# Patient Record
Sex: Female | Born: 1975 | Race: White | Hispanic: No | Smoking: Former smoker
Health system: Southern US, Community
[De-identification: ages and names within clinical notes are randomized; demographics above are authoritative.]

## PROBLEM LIST (undated history)

## (undated) DIAGNOSIS — R112 Nausea with vomiting, unspecified: Secondary | ICD-10-CM

## (undated) DIAGNOSIS — N6314 Unspecified lump in the right breast, lower inner quadrant: Secondary | ICD-10-CM

## (undated) DIAGNOSIS — G43709 Chronic migraine without aura, not intractable, without status migrainosus: Secondary | ICD-10-CM

## (undated) DIAGNOSIS — IMO0002 Reserved for concepts with insufficient information to code with codable children: Secondary | ICD-10-CM

## (undated) DIAGNOSIS — G90A Postural orthostatic tachycardia syndrome (POTS): Secondary | ICD-10-CM

## (undated) DIAGNOSIS — Z9889 Other specified postprocedural states: Secondary | ICD-10-CM

## (undated) DIAGNOSIS — K602 Anal fissure, unspecified: Secondary | ICD-10-CM

## (undated) DIAGNOSIS — I10 Essential (primary) hypertension: Secondary | ICD-10-CM

## (undated) DIAGNOSIS — I498 Other specified cardiac arrhythmias: Secondary | ICD-10-CM

## (undated) DIAGNOSIS — F988 Other specified behavioral and emotional disorders with onset usually occurring in childhood and adolescence: Secondary | ICD-10-CM

## (undated) HISTORY — DX: Reserved for concepts with insufficient information to code with codable children: IMO0002

## (undated) HISTORY — PX: WISDOM TOOTH EXTRACTION: SHX21

## (undated) HISTORY — PX: COLONOSCOPY: SHX174

## (undated) HISTORY — DX: Other specified behavioral and emotional disorders with onset usually occurring in childhood and adolescence: F98.8

## (undated) HISTORY — DX: Chronic migraine without aura, not intractable, without status migrainosus: G43.709

## (undated) HISTORY — DX: Essential (primary) hypertension: I10

## (undated) HISTORY — PX: LIPOSUCTION: SHX10

## (undated) HISTORY — DX: Anal fissure, unspecified: K60.2

---
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
## 2017-12-01 ENCOUNTER — Other Ambulatory Visit: Payer: Self-pay | Admitting: Physician Assistant

## 2017-12-01 DIAGNOSIS — Z139 Encounter for screening, unspecified: Secondary | ICD-10-CM

## 2017-12-20 ENCOUNTER — Encounter: Payer: Self-pay | Admitting: Gastroenterology

## 2017-12-29 ENCOUNTER — Ambulatory Visit
Admission: RE | Admit: 2017-12-29 | Discharge: 2017-12-29 | Disposition: A | Discharge status: Home / Self Care | Payer: 59 | Source: Ambulatory Visit | Attending: Physician Assistant | Admitting: Physician Assistant

## 2017-12-29 DIAGNOSIS — Z139 Encounter for screening, unspecified: Secondary | ICD-10-CM

## 2017-12-30 ENCOUNTER — Other Ambulatory Visit: Payer: Self-pay | Admitting: Physician Assistant

## 2017-12-30 DIAGNOSIS — R928 Other abnormal and inconclusive findings on diagnostic imaging of breast: Secondary | ICD-10-CM

## 2018-01-03 ENCOUNTER — Other Ambulatory Visit: Payer: 59

## 2018-01-07 ENCOUNTER — Ambulatory Visit
Admission: RE | Admit: 2018-01-07 | Discharge: 2018-01-07 | Disposition: A | Discharge status: Home / Self Care | Payer: 59 | Source: Ambulatory Visit | Attending: Physician Assistant | Admitting: Physician Assistant

## 2018-01-07 ENCOUNTER — Other Ambulatory Visit: Payer: Self-pay | Admitting: Physician Assistant

## 2018-01-07 DIAGNOSIS — R928 Other abnormal and inconclusive findings on diagnostic imaging of breast: Secondary | ICD-10-CM

## 2018-01-07 DIAGNOSIS — N6320 Unspecified lump in the left breast, unspecified quadrant: Secondary | ICD-10-CM

## 2018-01-11 ENCOUNTER — Other Ambulatory Visit: Payer: Self-pay | Admitting: Physician Assistant

## 2018-01-11 ENCOUNTER — Ambulatory Visit
Admission: RE | Admit: 2018-01-11 | Discharge: 2018-01-11 | Disposition: A | Discharge status: Home / Self Care | Payer: 59 | Source: Ambulatory Visit | Attending: Physician Assistant | Admitting: Physician Assistant

## 2018-01-11 DIAGNOSIS — N6320 Unspecified lump in the left breast, unspecified quadrant: Secondary | ICD-10-CM

## 2018-01-12 ENCOUNTER — Other Ambulatory Visit: Payer: 59

## 2018-02-03 ENCOUNTER — Ambulatory Visit: Payer: 59 | Admitting: Gastroenterology

## 2018-02-03 ENCOUNTER — Encounter: Payer: Self-pay | Admitting: Gastroenterology

## 2018-02-03 VITALS — BP 100/60 | HR 64 | Ht 66.0 in | Wt 150.0 lb

## 2018-02-03 DIAGNOSIS — Z8489 Family history of other specified conditions: Secondary | ICD-10-CM

## 2018-02-03 NOTE — Patient Instructions (Signed)
If you are age 44 or older, your body mass index should be between 23-30. Your Body mass index is 24.21 kg/m. If this is out of the aforementioned range listed, please consider follow up with your Primary Care Provider.  If you are age 71 or younger, your body mass index should be between 19-25. Your Body mass index is 24.21 kg/m. If this is out of the aformentioned range listed, please consider follow up with your Primary Care Provider.   You have been scheduled for a colonoscopy. Please follow written instructions given to you at your visit today.  Please pick up your prep supplies at the pharmacy within the next 1-3 days. If you use inhalers (even only as needed), please bring them with you on the day of your procedure. Your physician has requested that you go to www.startemmi.com and enter the access code given to you at your visit today. This web site gives a general overview about your procedure. However, you should still follow specific instructions given to you by our office regarding your preparation for the procedure.  It was a pleasure to meet you today!  Dr. Loletha Carrow

## 2018-02-03 NOTE — Progress Notes (Signed)
[facility] Consult Note:  History: Linda Whitney   "[name]" 02/03/2018  Referring physician: Bernerd Limbo, MD, Junie Spencer, PA  Reason for consult/chief complaint: Colon Cancer Screening (Fx Hx of colon cancer - Mom- dx at age 43/48)   Subjective  HPI:  Linda Whitney is a very pleasant 42 year old woman referred for family history of colon cancer.  She underwent colonoscopy perhaps 8 years ago while living in [facility].  The patient reports that her mother was diagnosed with colon cancer at age 16.    Her lifelong bowel pattern has been a BM about twice a week.  During pregnancy she had a lot of trouble with hemorrhoids and rectal bleeding, it has not bothered her lately.  She denies frequent heartburn, dysphagia, nausea, vomiting, early satiety or weight loss.  ROS:  Review of Systems She denies chest pain, dyspnea or dysuria  Past Medical History: Past Medical History:  Diagnosis Date  . ADD (attention deficit disorder)   . Anal fissure   . Chronic migraine      Past Surgical History: Past Surgical History:  Procedure Laterality Date  . LIPOSUCTION     lower back area after pregnancy     Family History: Family History  Problem Relation Age of Onset  . Colon cancer Mother 54   She does not know anymore about her family history on the mother's side since her mother was adopted. [name] has an adopted brother and no biological siblings.  Social History: Social History   Socioeconomic History  . Marital status: Divorced    Spouse name: Not on file  . Number of children: 4  . Years of education: Not on file  . Highest education level: Not on file  Occupational History  . Occupation: LPC  Social Needs  . Financial resource strain: Not on file  . Food insecurity:    Worry: Not on file    Inability: Not on file  . Transportation needs:    Medical: Not on file    Non-medical: Not on file  Tobacco Use  . Smoking status: Former  Smoker    Types: Cigarettes  . Smokeless tobacco: Never Used  Substance and Sexual Activity  . Alcohol use: Never    Frequency: Never  . Drug use: Never  . Sexual activity: Not on file  Lifestyle  . Physical activity:    Days per week: Not on file    Minutes per session: Not on file  . Stress: Not on file  Relationships  . Social connections:    Talks on phone: Not on file    Gets together: Not on file    Attends religious service: Not on file    Active member of club or organization: Not on file    Attends meetings of clubs or organizations: Not on file    Relationship status: Not on file  Other Topics Concern  . Not on file  Social History Narrative  . Not on file     Allergies: No Known Allergies  Outpatient Meds: Current Outpatient Medications  Medication Sig Dispense Refill  . amphetamine-dextroamphetamine (ADDERALL) 15 MG tablet Take 15 mg by mouth. Only takes on days she works    . b complex vitamins tablet Take 1 tablet by mouth daily.    Marland Kitchen latanoprost (XALATAN) 0.005 % ophthalmic solution Place 1 drop into both eyes at bedtime.    . MULTIPLE VITAMIN PO Take by mouth. 1 packet of powder daily    .  Omega-3-6-9 CAPS Take 2 capsules by mouth daily.     No current facility-administered medications for this visit.       ___________________________________________________________________ Objective   Exam:  BP 100/60   Pulse 64   Ht 5\' 6"  (1.676 m)   Wt 150 lb (68 kg)   BMI 24.21 kg/m    General: this is a(n) well-appearing woman  Eyes: sclera anicteric, no redness  ENT: oral mucosa moist without lesions, no cervical or supraclavicular lymphadenopathy, good dentition  CV: RRR without murmur, S1/S2, no JVD, no peripheral edema  Resp: clear to auscultation bilaterally, normal RR and effort noted  GI: soft, no tenderness, with active bowel sounds. No guarding or palpable organomegaly noted.  Skin; warm and dry, no rash or jaundice noted  Neuro:  awake, alert and oriented x 3. Normal gross motor function and fluent speech  Labs:  Normal CBC and LFTs at PCP appt Feb 19  Assessment: Encounter Diagnosis  Name Primary?  . Family history of benign neoplasm of colon Yes    Plan:  Colonoscopy.  She is agreeable after a discussion of procedure and risks.  The benefits and risks of the planned procedure were described in detail with the patient or (when appropriate) their health care proxy.  Risks were outlined as including, but not limited to, bleeding, infection, perforation, adverse medication reaction leading to cardiac or pulmonary decompensation, or pancreatitis (if ERCP).  The limitation of incomplete mucosal visualization was also discussed.  No guarantees or warranties were given.   Thank you for the courtesy of this consult.  Please call me with any questions or concerns.  Nelida Meuse III  CC: Mindi Curling, [place]

## 2018-04-22 ENCOUNTER — Encounter: Payer: Self-pay | Admitting: Gastroenterology

## 2018-05-02 ENCOUNTER — Ambulatory Visit (AMBULATORY_SURGERY_CENTER): Payer: 59 | Admitting: Gastroenterology

## 2018-05-02 ENCOUNTER — Encounter: Payer: Self-pay | Admitting: Gastroenterology

## 2018-05-02 VITALS — BP 107/74 | HR 73 | Temp 98.4°F | Resp 14 | Ht 66.0 in | Wt 150.0 lb

## 2018-05-02 DIAGNOSIS — Z800 Family history of malignant neoplasm of digestive organs: Secondary | ICD-10-CM | POA: Diagnosis present

## 2018-05-02 DIAGNOSIS — Z1211 Encounter for screening for malignant neoplasm of colon: Secondary | ICD-10-CM | POA: Diagnosis not present

## 2018-05-02 MED ORDER — SODIUM CHLORIDE 0.9 % IV SOLN: 500.0000 mL | Freq: Once | INTRAVENOUS | Status: DC

## 2018-05-02 NOTE — Op Note (Signed)
[facility] Patient Name: Linda Whitney Procedure Date: 05/02/2018 10:34 AM MRN: [id number] Endoscopist: Mallie Mussel L. Loletha Carrow , MD Age: 42 Referring MD:  Date of Birth: 1976/08/27 Gender: Female Account #: [id number] Procedure:                Colonoscopy Indications:              Screening in patient at increased risk: Colorectal                            cancer in mother before age 13 Medicines:                Monitored Anesthesia Care Procedure:                Pre-Anesthesia Assessment:                           - Prior to the procedure, a History and Physical                            was performed, and patient medications and                            allergies were reviewed. The patient's tolerance of                            previous anesthesia was also reviewed. The risks                            and benefits of the procedure and the sedation                            options and risks were discussed with the patient.                            All questions were answered, and informed consent                            was obtained. Prior Anticoagulants: The patient has                            taken no previous anticoagulant or antiplatelet                            agents. ASA Grade Assessment: II - A patient with                            mild systemic disease. After reviewing the risks                            and benefits, the patient was deemed in                            satisfactory condition to undergo the procedure.  After obtaining informed consent, the colonoscope                            was passed under direct vision. Throughout the                            procedure, the patient's blood pressure, pulse, and                            oxygen saturations were monitored continuously. The                            Colonoscope was introduced through the anus and                            advanced to the the cecum,  identified by                            appendiceal orifice and ileocecal valve. The                            colonoscopy was performed without difficulty. The                            patient tolerated the procedure well. The quality                            of the bowel preparation was good. The ileocecal                            valve, appendiceal orifice, and rectum were                            photographed. The quality of the bowel preparation                            was evaluated using the BBPS [facility] Bowel                            Preparation Scale) with scores of: Right Colon = 2,                            Transverse Colon = 2 and Left Colon = 2. The total                            BBPS score equals 6. The bowel preparation used was                            Plenvu. Scope In: 10:56:24 AM Scope Out: 11:14:28 AM Scope Withdrawal Time: 0 hours 9 minutes 27 seconds  Total Procedure Duration: 0 hours 18 minutes 4 seconds  Findings:                 The perianal and digital rectal  examinations were                            normal.                           The entire examined colon appeared normal on direct                            and retroflexion views. Complications:            No immediate complications. Estimated Blood Loss:     Estimated blood loss: none. Impression:               - The entire examined colon is normal on direct and                            retroflexion views.                           - No specimens collected. Recommendation:           - Patient has a contact number available for                            emergencies. The signs and symptoms of potential                            delayed complications were discussed with the                            patient. Return to normal activities tomorrow.                            Written discharge instructions were provided to the                            patient.                            - Resume previous diet.                           - Continue present medications.                           - Repeat colonoscopy in 5 years for screening                            purposes. Asante Ritacco L. Loletha Carrow, MD 05/02/2018 11:19:26 AM This report has been signed electronically.

## 2018-05-02 NOTE — Patient Instructions (Signed)
YOU HAD AN ENDOSCOPIC PROCEDURE TODAY AT [facility]:   Refer to the procedure report that was given to you for any specific questions about what was found during the examination.  If the procedure report does not answer your questions, please call your gastroenterologist to clarify.  If you requested that your care partner not be given the details of your procedure findings, then the procedure report has been included in a sealed envelope for you to review at your convenience later.  YOU SHOULD EXPECT: Some feelings of bloating in the abdomen. Passage of more gas than usual.  Walking can help get rid of the air that was put into your GI tract during the procedure and reduce the bloating. If you had a lower endoscopy (such as a colonoscopy or flexible sigmoidoscopy) you may notice spotting of blood in your stool or on the toilet paper. If you underwent a bowel prep for your procedure, you may not have a normal bowel movement for a few days.  Please Note:  You might notice some irritation and congestion in your nose or some drainage.  This is from the oxygen used during your procedure.  There is no need for concern and it should clear up in a day or so.  SYMPTOMS TO REPORT IMMEDIATELY:   Following lower endoscopy (colonoscopy or flexible sigmoidoscopy):  Excessive amounts of blood in the stool  Significant tenderness or worsening of abdominal pains  Swelling of the abdomen that is new, acute  Fever of 100F or higher   Following upper endoscopy (EGD)  Vomiting of blood or coffee ground material  New chest pain or pain under the shoulder blades  Painful or persistently difficult swallowing  New shortness of breath  Fever of 100F or higher  Black, tarry-looking stools  For urgent or emergent issues, a gastroenterologist can be reached at any hour by calling [phone].   DIET:  We do recommend a small meal at first, but then you may proceed to your regular diet.  Drink  plenty of fluids but you should avoid alcoholic beverages for 24 hours.  ACTIVITY:  You should plan to take it easy for the rest of today and you should NOT DRIVE or use heavy machinery until tomorrow (because of the sedation medicines used during the test).    FOLLOW UP: Our staff will call the number listed on your records the next business day following your procedure to check on you and address any questions or concerns that you may have regarding the information given to you following your procedure. If we do not reach you, we will leave a message.  However, if you are feeling well and you are not experiencing any problems, there is no need to return our call.  We will assume that you have returned to your regular daily activities without incident.  If any biopsies were taken you will be contacted by phone or by letter within the next 1-3 weeks.  Please call us at [phone] if you have not heard about the biopsies in 3 weeks.    SIGNATURES/CONFIDENTIALITY: You and/or your care partner have signed paperwork which will be entered into your electronic medical record.  These signatures attest to the fact that that the information above on your After Visit Summary has been reviewed and is understood.  Full responsibility of the confidentiality of this discharge information lies with you and/or your care-partner.YOU HAD AN ENDOSCOPIC PROCEDURE TODAY AT [facility]:  Refer to the procedure report that was given to you for any specific questions about what was found during the examination.  If the procedure report does not answer your questions, please call your gastroenterologist to clarify.  If you requested that your care partner not be given the details of your procedure findings, then the procedure report has been included in a sealed envelope for you to review at your convenience later.  YOU SHOULD EXPECT: Some feelings of bloating in the abdomen. Passage of more gas than  usual.  Walking can help get rid of the air that was put into your GI tract during the procedure and reduce the bloating. If you had a lower endoscopy (such as a colonoscopy or flexible sigmoidoscopy) you may notice spotting of blood in your stool or on the toilet paper. If you underwent a bowel prep for your procedure, you may not have a normal bowel movement for a few days.  Please Note:  You might notice some irritation and congestion in your nose or some drainage.  This is from the oxygen used during your procedure.  There is no need for concern and it should clear up in a day or so.  SYMPTOMS TO REPORT IMMEDIATELY:   Following lower endoscopy (colonoscopy or flexible sigmoidoscopy):  Excessive amounts of blood in the stool  Significant tenderness or worsening of abdominal pains  Swelling of the abdomen that is new, acute  Fever of 100F or higher  For urgent or emergent issues, a gastroenterologist can be reached at any hour by calling [phone].   DIET:  We do recommend a small meal at first, but then you may proceed to your regular diet.  Drink plenty of fluids but you should avoid alcoholic beverages for 24 hours.  ACTIVITY:  You should plan to take it easy for the rest of today and you should NOT DRIVE or use heavy machinery until tomorrow (because of the sedation medicines used during the test).    FOLLOW UP: Our staff will call the number listed on your records the next business day following your procedure to check on you and address any questions or concerns that you may have regarding the information given to you following your procedure. If we do not reach you, we will leave a message.  However, if you are feeling well and you are not experiencing any problems, there is no need to return our call.  We will assume that you have returned to your regular daily activities without incident.  If any biopsies were taken you will be contacted by phone or by letter within the next  1-3 weeks.  Please call us at [phone] if you have not heard about the biopsies in 3 weeks.   Repeat next Colonoscopy screening in 5 years  SIGNATURES/CONFIDENTIALITY: You and/or your care partner have signed paperwork which will be entered into your electronic medical record.  These signatures attest to the fact that that the information above on your After Visit Summary has been reviewed and is understood.  Full responsibility of the confidentiality of this discharge information lies with you and/or your care-partner.

## 2018-05-02 NOTE — Progress Notes (Signed)
A and O x3. Report to RN. Tolerated MAC anesthesia well.

## 2018-05-03 ENCOUNTER — Telehealth: Payer: Self-pay | Admitting: *Deleted

## 2018-05-03 NOTE — Telephone Encounter (Signed)
  Follow up Call-  Call back number 05/02/2018  Post procedure Call Back phone  # [phone] cell  Permission to leave phone message Yes     Patient questions:  Do you have a fever, pain , or abdominal swelling? No. Pain Score  0 *  Have you tolerated food without any problems? Yes.    Have you been able to return to your normal activities? Yes.    Do you have any questions about your discharge instructions: Diet   No. Medications  No. Follow up visit  No.  Do you have questions or concerns about your Care? No.  Actions: * If pain score is 4 or above: No action needed, pain <4.  Pt did say she ran a fever last night 100.4 after she had taken Tylenol. No fever since and has not taken any more Tylenol.Encouraged pt to drink plenty of fluids today and to call if this does not improve . Pt denied any other symptoms such as bleeding or pain .

## 2019-02-15 ENCOUNTER — Other Ambulatory Visit: Payer: Self-pay | Admitting: Physician Assistant

## 2019-02-15 DIAGNOSIS — N6320 Unspecified lump in the left breast, unspecified quadrant: Secondary | ICD-10-CM

## 2019-02-27 ENCOUNTER — Other Ambulatory Visit: Payer: Self-pay | Admitting: Physician Assistant

## 2019-02-27 DIAGNOSIS — N63 Unspecified lump in unspecified breast: Secondary | ICD-10-CM

## 2019-03-01 ENCOUNTER — Other Ambulatory Visit: Payer: Self-pay

## 2019-03-01 ENCOUNTER — Ambulatory Visit
Admission: RE | Admit: 2019-03-01 | Discharge: 2019-03-01 | Disposition: A | Discharge status: Home / Self Care | Payer: 59 | Source: Ambulatory Visit | Attending: Physician Assistant | Admitting: Physician Assistant

## 2019-03-01 ENCOUNTER — Other Ambulatory Visit: Payer: Self-pay | Admitting: Physician Assistant

## 2019-03-01 ENCOUNTER — Ambulatory Visit: Payer: 59

## 2019-03-01 DIAGNOSIS — N6320 Unspecified lump in the left breast, unspecified quadrant: Secondary | ICD-10-CM

## 2019-03-01 DIAGNOSIS — N63 Unspecified lump in unspecified breast: Secondary | ICD-10-CM

## 2019-03-06 ENCOUNTER — Ambulatory Visit
Admission: RE | Admit: 2019-03-06 | Discharge: 2019-03-06 | Disposition: A | Discharge status: Home / Self Care | Payer: 59 | Source: Ambulatory Visit | Attending: Physician Assistant | Admitting: Physician Assistant

## 2019-03-06 ENCOUNTER — Other Ambulatory Visit: Payer: Self-pay

## 2019-03-06 DIAGNOSIS — N63 Unspecified lump in unspecified breast: Secondary | ICD-10-CM

## 2019-03-16 ENCOUNTER — Other Ambulatory Visit: Payer: Self-pay | Admitting: General Surgery

## 2019-03-16 DIAGNOSIS — N6320 Unspecified lump in the left breast, unspecified quadrant: Secondary | ICD-10-CM

## 2019-03-23 ENCOUNTER — Other Ambulatory Visit: Payer: Self-pay | Admitting: General Surgery

## 2019-03-23 DIAGNOSIS — N6310 Unspecified lump in the right breast, unspecified quadrant: Secondary | ICD-10-CM

## 2019-04-07 NOTE — H&P (Signed)
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 Linda Spencer, PA provides primary care. She goes by"Linda Whitney". Linda Whitney was my chaperone throughout the encounter      She's been followed for some time because of stable bilateral benign masses. She did have a core needle biopsy of her left breast a year ago which was benign There is a benign-appearing mass in the right breast at the 4 o'clock position 2 cm from the nipple. This has enlarged in size from 1.8-2.1 cm. Axillary ultrasound negative. Because of increasing size she underwent core biopsy and that she has fibroepithelial lesion. Differential includes fibroadenoma versus phyllodes tumor. She was referred for consideration of excision which is appropriate.      Family history is negative for breast or ovarian cancer. Linda Whitney died of recurrent colorectal cancer. The Linda Whitney was adopted. Father is living and well. Patient's medical history reveals colonoscopy 2019. Anal fissure. ADD. Migraine headaches. Left breast biopsy 2019. Liposuction Social history she is divorced. Lives in [place]. She has 4 children. 63 year old son lives with her. 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 6 alcohol occasionally. Denies tobacco.      I discussed the physical findings, imaging findings, and pathology report. She understands the differential diagnosis. She wants the lesion removed. I think we can do this through a conservative incision, hopefully circumareolar. To guide this will go to use a radioactive seed She'll be scheduled for right breast lumpectomy with radioactive seed  localization. I discussed the indications, details, techniques with her. She is aware of the risk of bleeding, infection, chronic pain, cosmetic deformity, reoperation if cancer. She knows that I will be fairly conservative and not try to get wide margins. She understands all these issues. All her questions were answered. She agrees with this plan.     Past Surgical History  Breast Biopsy  Bilateral. Oral Surgery   Diagnostic Studies History Colonoscopy  within last year Mammogram  within last year Pap Smear  1-5 years ago  Allergies  Nickel  Dermatitis. Adhesive Tape  Dermatitis. HYDROcodone-Acetaminophen *ANALGESICS - OPIOID*  Nausea, Vomiting. Allergies Reconciled   Medication History  Botox (Injection) Specific strength unknown - Active. Collagen (Oral) Specific strength unknown - Active. Xalatan (0.005% Solution, Ophthalmic) Active. Multi Vitamin (Oral) Active. Omega 3-6-9 (Oral) Active. Maxalt (10MG  Tablet, Oral) Active. Adderall (Oral) Specific strength unknown - Active. B12 Folate (800-800MCG Capsule, Oral) Active. Probiotic (Oral) Specific strength unknown - Active. Medications Reconciled  Social History  Alcohol use  Occasional alcohol use. Caffeine use  Coffee. No drug use  Tobacco use  Former smoker.  Family History Alcohol Abuse  Father. Colon Cancer  Linda Whitney. Migraine Headache  Linda Whitney. Respiratory Condition  Linda Whitney.  Pregnancy / Birth History  Gravida  3 Length (months) of breastfeeding  7-12 Maternal age  5-20 Para  2 Regular periods   Other Problem Gastroesophageal Reflux Disease  Lump In Breast  Migraine Headache     Review of Systems General Present- Weight Gain. Not Present- Appetite Loss, Chills, Fatigue, Fever, Night Sweats and Weight Loss. Skin Not Present- Change in Wart/Mole, Dryness, Hives, Jaundice, New Lesions, Non-Healing  Wounds, Rash and Ulcer. HEENT Not Present- Earache, Hearing Loss,  Hoarseness, Nose Bleed, Oral Ulcers, Ringing in the Ears, Seasonal Allergies, Sinus Pain, Sore Throat, Visual Disturbances, Wears glasses/contact lenses and Yellow Eyes. Respiratory Not Present- Bloody sputum, Chronic Cough, Difficulty Breathing, Snoring and Wheezing. Breast Present- Breast Mass. Not Present- Breast Pain, Nipple Discharge and Skin Changes. Cardiovascular Not Present- Chest Pain, Difficulty Breathing Lying Down, Leg Cramps, Palpitations, Rapid Heart Rate, Shortness of Breath and Swelling of Extremities. Gastrointestinal Not Present- Abdominal Pain, Bloating, Bloody Stool, Change in Bowel Habits, Chronic diarrhea, Constipation, Difficulty Swallowing, Excessive gas, Gets full quickly at meals, Hemorrhoids, Indigestion, Nausea, Rectal Pain and Vomiting. Female Genitourinary Not Present- Frequency, Nocturia, Painful Urination, Pelvic Pain and Urgency. Musculoskeletal Not Present- Back Pain, Joint Pain, Joint Stiffness, Muscle Pain, Muscle Weakness and Swelling of Extremities. Neurological Not Present- Decreased Memory, Fainting, Headaches, Numbness, Seizures, Tingling, Tremor, Trouble walking and Weakness. Psychiatric Not Present- Anxiety, Bipolar, Change in Sleep Pattern, Depression, Fearful and Frequent crying. Endocrine Not Present- Cold Intolerance, Excessive Hunger, Hair Changes, Heat Intolerance, Hot flashes and New Diabetes. Hematology Not Present- Blood Thinners, Easy Bruising, Excessive bleeding, Gland problems, HIV and Persistent Infections.  Vitals Weight: 166 lb Height: 65in Body Surface Area: 1.83 m Body Mass Index: 27.62 kg/m  Temp.: 98.53F (Oral)  Pulse: 87 (Regular)  P.OX: 98% (Room air) BP: 102/78(Sitting, Left Arm, Standard) Pt stated that her BP is normally on the lower side.      Physical Exam  General Mental Status-Alert. General Appearance-Consistent with stated age. Hydration-Well hydrated. Voice-Normal.  Integumentary Note:  Tattoos left infraclavicular area, left upper extremity, left lower extremity, anterior abdominal wall   Head and Neck Head-normocephalic, atraumatic with no lesions or palpable masses. Trachea-midline. Thyroid Gland Characteristics - normal size and consistency.  Eye Eyeball - Bilateral-Extraocular movements intact. Sclera/Conjunctiva - Bilateral-No scleral icterus.  Chest and Lung Exam Chest and lung exam reveals -quiet, even and easy respiratory effort with no use of accessory muscles and on auscultation, normal breath sounds, no adventitious sounds and normal vocal resonance. Inspection Chest Wall - Normal. Back - normal.  Breast Note: Breasts are large. Symmetrical. Mild diffuse lumpiness. She has nipple jewelry bilaterally. There is a 2 cm firm rubbery mobile mass at the 4 o'clock position of the right breast, just outside the areolar margin. There are no other dominant masses on either side. There is no axillary adenopathy.   Cardiovascular Cardiovascular examination reveals -normal heart sounds, regular rate and rhythm with no murmurs and normal pedal pulses bilaterally.  Abdomen Inspection Inspection of the abdomen reveals - No Hernias. Skin - Scar - no surgical scars. Palpation/Percussion Palpation and Percussion of the abdomen reveal - Soft, Non Tender, No Rebound tenderness, No Rigidity (guarding) and No hepatosplenomegaly. Auscultation Auscultation of the abdomen reveals - Bowel sounds normal.  Neurologic Neurologic evaluation reveals -alert and oriented x 3 with no impairment of recent or remote memory. Mental Status-Normal.  Musculoskeletal Normal Exam - Left-Upper Extremity Strength Normal and Lower Extremity Strength Normal. Normal Exam - Right-Upper Extremity Strength Normal and Lower Extremity Strength Normal.  Lymphatic Head & Neck  General Head & Neck Lymphatics: Bilateral - Description - Normal. Axillary  General Axillary  Region: Bilateral - Description - Normal. Tenderness - Non Tender. Femoral & Inguinal  Generalized Femoral & Inguinal Lymphatics: Bilateral - Description - Normal. Tenderness - Non Tender.    Assessment & PLan  BREAST MASS, RIGHT (N63.10)  You have a 2 cm mass in your right breast, lower  outer quadrant. This is fairly close to the nipple and areola I can feel this on physical exam It has enlarged somewhat over the past year Image guided core needle biopsy shows a fibroepithelial lesion I gave you a copy of the pathology report. Most likely this is a benign fibroadenoma but there is a small chance you could have a tumor called phyllodes which can grow much larger  I have advised right breast lumpectomy with radioactive seed localization and you agree I have discussed the indications, techniques, and risk of the surgery with him in detail   HISTORY OF MIGRAINE HEADACHES (Z86.69)  FAMILY HISTORY OF COLON CANCER IN Linda Whitney (Z80.0)  HISTORY OF BREAST BIOPSY (Z98.890)    Edsel Petrin. Dalbert Batman, M.D., [facility] General and Minimally invasive Surgery Breast and Colorectal Surgery Office:   [phone] Pager:   [phone]

## 2019-04-07 NOTE — Pre-Procedure Instructions (Signed)
[id number] - [place], [place] [address]. AT [address] [place] [address] Phone: [phone] Fax: [phone]      Your procedure is scheduled on  04-14-19  Report to [facility] Main Entrance "A" at 10A.M., and check in at the Admitting office.  Call this number if you have problems the morning of surgery:  [phone]  Call [phone] if you have any questions prior to your surgery date Monday-Friday 8am-4pm    Remember:  Do not eat drink after midnight the night before your surgery  You may drink clear liquids until 9AM the morning of your surgery.   Clear liquids allowed are: Water, Non-Citrus Juices (without pulp), Carbonated Beverages, Clear Tea, Black Coffee Only, and Gatorade   Take these medicines the morning of surgery with A SIP OF WATER :none   7 days prior to surgery STOP taking any Aspirin (unless otherwise instructed by your surgeon), Aleve, Naproxen, Ibuprofen, Motrin, Advil, Goody's, BC's, all herbal medications, fish oil, and all vitamins.    The Morning of Surgery  Do not wear jewelry, make-up or nail polish.  Do not wear lotions, powders, or perfumes/colognes, or deodorant  Do not shave 48 hours prior to surgery.  Men may shave face and neck.  Do not bring valuables to the hospital.  [facility] is not responsible for any belongings or valuables.  If you are a smoker, DO NOT Smoke 24 hours prior to surgery IF you wear a CPAP at night please bring your mask, tubing, and machine the morning of surgery   Remember that you must have someone to transport you home after your surgery, and remain with you for 24 hours if you are discharged the same day.   Contacts, glasses, hearing aids, dentures or bridgework may not be worn into surgery.    Leave your suitcase in the car.  After surgery it may be brought to your room.  For patients admitted to the hospital, discharge time will be  determined by your treatment team.  Patients discharged the day of surgery will not be allowed to drive home.    Special instructions:   [facility]- Preparing For Surgery  Before surgery, you can play an important role. Because skin is not sterile, your skin needs to be as free of germs as possible. You can reduce the number of germs on your skin by washing with CHG (chlorahexidine gluconate) Soap before surgery.  CHG is an antiseptic cleaner which kills germs and bonds with the skin to continue killing germs even after washing.    Oral Hygiene is also important to reduce your risk of infection.  Remember - BRUSH YOUR TEETH THE MORNING OF SURGERY WITH YOUR REGULAR TOOTHPASTE  Please do not use if you have an allergy to CHG or antibacterial soaps. If your skin becomes reddened/irritated stop using the CHG.  Do not shave (including legs and underarms) for at least 48 hours prior to first CHG shower. It is OK to shave your face.  Please follow these instructions carefully.   1. Shower the NIGHT BEFORE SURGERY and the MORNING OF SURGERY with CHG Soap.   2. If you chose to wash your hair, wash your hair first as usual with your normal shampoo.  3. After you shampoo, rinse your hair and body thoroughly to remove the shampoo.  4. Use CHG as you would any other liquid soap. You can apply CHG directly to the skin and wash gently  with a scrungie or a clean washcloth.   5. Apply the CHG Soap to your body ONLY FROM THE NECK DOWN.  Do not use on open wounds or open sores. Avoid contact with your eyes, ears, mouth and genitals (private parts). Wash Face and genitals (private parts)  with your normal soap.   6. Wash thoroughly, paying special attention to the area where your surgery will be performed.  7. Thoroughly rinse your body with warm water from the neck down.  8. DO NOT shower/wash with your normal soap after using and rinsing off the CHG Soap.  9. Pat yourself dry with a CLEAN  TOWEL.  10. Wear CLEAN PAJAMAS to bed the night before surgery, wear comfortable clothes the morning of surgery  11. Place CLEAN SHEETS on your bed the night of your first shower and DO NOT SLEEP WITH PETS.  Day of Surgery:  Do not apply any deodorants/lotions. Please shower the morning of surgery with the CHG soap  Please wear clean clothes to the hospital/surgery center.   Remember to brush your teeth WITH YOUR REGULAR TOOTHPASTE.  Please read over the fact sheets that you were given.

## 2019-04-10 ENCOUNTER — Other Ambulatory Visit: Payer: Self-pay

## 2019-04-10 ENCOUNTER — Encounter (HOSPITAL_COMMUNITY): Payer: Self-pay

## 2019-04-10 ENCOUNTER — Encounter (HOSPITAL_COMMUNITY)
Admission: RE | Admit: 2019-04-10 | Discharge: 2019-04-10 | Disposition: A | Discharge status: Home / Self Care | Payer: 59 | Source: Ambulatory Visit | Attending: General Surgery | Admitting: General Surgery

## 2019-04-10 DIAGNOSIS — Z20828 Contact with and (suspected) exposure to other viral communicable diseases: Secondary | ICD-10-CM | POA: Insufficient documentation

## 2019-04-10 DIAGNOSIS — I10 Essential (primary) hypertension: Secondary | ICD-10-CM | POA: Insufficient documentation

## 2019-04-10 DIAGNOSIS — Z01818 Encounter for other preprocedural examination: Secondary | ICD-10-CM | POA: Diagnosis present

## 2019-04-10 HISTORY — DX: Postural orthostatic tachycardia syndrome (POTS): G90.A

## 2019-04-10 HISTORY — DX: Other specified postprocedural states: R11.2

## 2019-04-10 HISTORY — DX: Other specified postprocedural states: Z98.890

## 2019-04-10 HISTORY — DX: Other specified cardiac arrhythmias: I49.8

## 2019-04-10 LAB — BASIC METABOLIC PANEL
Anion gap: 8 (ref 5–15)
BUN: 14 mg/dL (ref 6–20)
CO2: 24 mmol/L (ref 22–32)
Calcium: 8.5 mg/dL — ABNORMAL LOW (ref 8.9–10.3)
Chloride: 105 mmol/L (ref 98–111)
Creatinine, Ser: 0.79 mg/dL (ref 0.44–1.00)
GFR calc Af Amer: >60 mL/min (ref >60)
GFR calc non Af Amer: >60 mL/min (ref >60)
Glucose, Bld: 106 mg/dL — ABNORMAL HIGH (ref 70–99)
Potassium: 4.3 mmol/L (ref 3.5–5.1)
Sodium: 137 mmol/L (ref 135–145)

## 2019-04-10 LAB — POCT PREGNANCY, URINE: Preg Test, Ur: NEGATIVE

## 2019-04-10 LAB — CBC
HCT: 39.6 % (ref 36.0–46.0)
Hemoglobin: 12.6 g/dL (ref 12.0–15.0)
MCH: 30.9 pg (ref 26.0–34.0)
MCHC: 31.8 g/dL (ref 30.0–36.0)
MCV: 97.1 fL (ref 80.0–100.0)
Platelets: 225 10*3/uL (ref 150–400)
RBC: 4.08 MIL/uL (ref 3.87–5.11)
RDW: 12.1 % (ref 11.5–15.5)
WBC: 5.5 10*3/uL (ref 4.0–10.5)
nRBC: 0 % (ref 0.0–0.2)

## 2019-04-10 NOTE — Progress Notes (Signed)
PCP - Junie Spencer PA-C Cardiologist - denies  Chest x-ray - denies EKG - denies Stress Test - denies  ECHO - denies Cardiac Cath - denies  Sleep Study - denies CPAP - denies  Blood Thinner Instructions: N/A Aspirin Instructions: N/A  Anesthesia review: No  Coronavirus Screening  Have you experienced the following symptoms:  Cough yes/no: No Fever (>100.74F)  yes/no: No Runny nose yes/no: No Sore throat yes/no: No Difficulty breathing/shortness of breath  yes/no: No  Have you or a family member traveled in the last 14 days and where? yes/no: No  If the patient indicates "YES" to the above questions, their PAT will be rescheduled to limit the exposure to others and, the surgeon will be notified. THE PATIENT WILL NEED TO BE ASYMPTOMATIC FOR 14 DAYS.   If the patient is not experiencing any of these symptoms, the PAT nurse will instruct them to NOT bring anyone with them to their appointment since they may have these symptoms or traveled as well.   Please remind your patients and families that hospital visitation restrictions are in effect and the importance of the restrictions.   Patient denies shortness of breath, fever, cough and chest pain at PAT appointment  Patient verbalized understanding of instructions that were given to them at the PAT appointment. Patient was also instructed that they will need to review over the PAT instructions again at home before surgery.

## 2019-04-11 ENCOUNTER — Other Ambulatory Visit (HOSPITAL_COMMUNITY)
Admission: RE | Admit: 2019-04-11 | Discharge: 2019-04-11 | Disposition: A | Discharge status: Home / Self Care | Payer: 59 | Source: Ambulatory Visit | Attending: General Surgery | Admitting: General Surgery

## 2019-04-11 DIAGNOSIS — Z01818 Encounter for other preprocedural examination: Secondary | ICD-10-CM | POA: Diagnosis not present

## 2019-04-11 LAB — SARS CORONAVIRUS 2 (TAT 6-24 HRS): SARS Coronavirus 2: NEGATIVE

## 2019-04-13 ENCOUNTER — Ambulatory Visit
Admission: RE | Admit: 2019-04-13 | Discharge: 2019-04-13 | Disposition: A | Discharge status: Home / Self Care | Payer: 59 | Source: Ambulatory Visit | Attending: General Surgery | Admitting: General Surgery

## 2019-04-13 ENCOUNTER — Other Ambulatory Visit: Payer: Self-pay

## 2019-04-13 DIAGNOSIS — N6310 Unspecified lump in the right breast, unspecified quadrant: Secondary | ICD-10-CM

## 2019-04-14 ENCOUNTER — Ambulatory Visit
Admission: RE | Admit: 2019-04-14 | Discharge: 2019-04-14 | Disposition: A | Discharge status: Home / Self Care | Payer: 59 | Source: Ambulatory Visit | Attending: General Surgery | Admitting: General Surgery

## 2019-04-14 ENCOUNTER — Encounter (HOSPITAL_COMMUNITY)
Admission: RE | Disposition: A | Discharge status: Home / Self Care | Payer: Self-pay | Source: Home / Self Care | Attending: General Surgery

## 2019-04-14 ENCOUNTER — Ambulatory Visit (HOSPITAL_COMMUNITY): Payer: 59 | Admitting: Anesthesiology

## 2019-04-14 ENCOUNTER — Encounter (HOSPITAL_COMMUNITY): Payer: Self-pay | Admitting: Anesthesiology

## 2019-04-14 ENCOUNTER — Ambulatory Visit (HOSPITAL_COMMUNITY)
Admission: RE | Admit: 2019-04-14 | Discharge: 2019-04-14 | Disposition: A | Discharge status: Home / Self Care | Payer: 59 | Attending: General Surgery | Admitting: General Surgery

## 2019-04-14 DIAGNOSIS — Z79899 Other long term (current) drug therapy: Secondary | ICD-10-CM | POA: Insufficient documentation

## 2019-04-14 DIAGNOSIS — Z87891 Personal history of nicotine dependence: Secondary | ICD-10-CM | POA: Diagnosis not present

## 2019-04-14 DIAGNOSIS — D241 Benign neoplasm of right breast: Secondary | ICD-10-CM | POA: Insufficient documentation

## 2019-04-14 DIAGNOSIS — I10 Essential (primary) hypertension: Secondary | ICD-10-CM | POA: Insufficient documentation

## 2019-04-14 DIAGNOSIS — N6314 Unspecified lump in the right breast, lower inner quadrant: Secondary | ICD-10-CM | POA: Diagnosis present

## 2019-04-14 DIAGNOSIS — F988 Other specified behavioral and emotional disorders with onset usually occurring in childhood and adolescence: Secondary | ICD-10-CM | POA: Insufficient documentation

## 2019-04-14 DIAGNOSIS — G43909 Migraine, unspecified, not intractable, without status migrainosus: Secondary | ICD-10-CM | POA: Diagnosis not present

## 2019-04-14 DIAGNOSIS — N6310 Unspecified lump in the right breast, unspecified quadrant: Secondary | ICD-10-CM

## 2019-04-14 DIAGNOSIS — Z800 Family history of malignant neoplasm of digestive organs: Secondary | ICD-10-CM | POA: Diagnosis not present

## 2019-04-14 HISTORY — PX: BREAST LUMPECTOMY WITH RADIOACTIVE SEED LOCALIZATION: SHX6424

## 2019-04-14 HISTORY — DX: Unspecified lump in the right breast, lower inner quadrant: N63.14

## 2019-04-14 HISTORY — PX: BREAST EXCISIONAL BIOPSY: SUR124

## 2019-04-14 SURGERY — BREAST LUMPECTOMY WITH RADIOACTIVE SEED LOCALIZATION
Anesthesia: General | Site: Breast | Laterality: Right

## 2019-04-14 MED ORDER — ONDANSETRON HCL 4 MG/2ML IJ SOLN
INTRAMUSCULAR | Status: DC | PRN
  Administered 2019-04-14: 4 mg via INTRAVENOUS

## 2019-04-14 MED ORDER — GABAPENTIN 300 MG PO CAPS
300.0000 mg | ORAL_CAPSULE | ORAL | Status: AC
  Administered 2019-04-14: 300 mg via ORAL
  Filled 2019-04-14: qty 1

## 2019-04-14 MED ORDER — KETAMINE HCL 50 MG/5ML IJ SOSY
PREFILLED_SYRINGE | INTRAMUSCULAR | Status: AC
  Filled 2019-04-14: qty 5

## 2019-04-14 MED ORDER — BUPIVACAINE-EPINEPHRINE 0.25% -1:200000 IJ SOLN
INTRAMUSCULAR | Status: DC | PRN
  Administered 2019-04-14: 10 mL

## 2019-04-14 MED ORDER — TRAMADOL HCL 50 MG PO TABS
ORAL_TABLET | ORAL | Status: AC
  Filled 2019-04-14: qty 1

## 2019-04-14 MED ORDER — ACETAMINOPHEN 500 MG PO TABS
1000.0000 mg | ORAL_TABLET | ORAL | Status: AC
  Administered 2019-04-14: 1000 mg via ORAL
  Filled 2019-04-14: qty 2

## 2019-04-14 MED ORDER — MIDAZOLAM HCL 5 MG/5ML IJ SOLN
INTRAMUSCULAR | Status: DC | PRN
  Administered 2019-04-14: 2 mg via INTRAVENOUS

## 2019-04-14 MED ORDER — CHLORHEXIDINE GLUCONATE CLOTH 2 % EX PADS: 6.0000 | MEDICATED_PAD | Freq: Once | CUTANEOUS | Status: DC

## 2019-04-14 MED ORDER — FENTANYL CITRATE (PF) 100 MCG/2ML IJ SOLN
25.0000 ug | INTRAMUSCULAR | Status: DC | PRN
  Administered 2019-04-14: 50 ug via INTRAVENOUS

## 2019-04-14 MED ORDER — CEFAZOLIN SODIUM-DEXTROSE 2-4 GM/100ML-% IV SOLN
2.0000 g | INTRAVENOUS | Status: AC
  Administered 2019-04-14: 2 g via INTRAVENOUS
  Filled 2019-04-14: qty 100

## 2019-04-14 MED ORDER — ACETAMINOPHEN 650 MG RE SUPP
650.0000 mg | RECTAL | Status: DC | PRN
  Filled 2019-04-14: qty 1

## 2019-04-14 MED ORDER — SODIUM CHLORIDE 0.9% FLUSH: 3.0000 mL | INTRAVENOUS | Status: DC | PRN

## 2019-04-14 MED ORDER — PROPOFOL 10 MG/ML IV BOLUS
INTRAVENOUS | Status: DC | PRN
  Administered 2019-04-14: 200 mg via INTRAVENOUS

## 2019-04-14 MED ORDER — LIDOCAINE 2% (20 MG/ML) 5 ML SYRINGE
INTRAMUSCULAR | Status: DC | PRN
  Administered 2019-04-14: 100 mg via INTRAVENOUS

## 2019-04-14 MED ORDER — FENTANYL CITRATE (PF) 100 MCG/2ML IJ SOLN
INTRAMUSCULAR | Status: AC
  Filled 2019-04-14: qty 2

## 2019-04-14 MED ORDER — METOCLOPRAMIDE HCL 5 MG/ML IJ SOLN: 10.0000 mg | Freq: Once | INTRAMUSCULAR | Status: DC | PRN

## 2019-04-14 MED ORDER — KETAMINE HCL 10 MG/ML IJ SOLN
INTRAMUSCULAR | Status: DC | PRN
  Administered 2019-04-14: 20 mg via INTRAVENOUS
  Administered 2019-04-14: 5 mg via INTRAVENOUS

## 2019-04-14 MED ORDER — MIDAZOLAM HCL 2 MG/2ML IJ SOLN
INTRAMUSCULAR | Status: AC
  Filled 2019-04-14: qty 2

## 2019-04-14 MED ORDER — LACTATED RINGERS IV SOLN
INTRAVENOUS | Status: DC | PRN
  Administered 2019-04-14: 11:00:00 via INTRAVENOUS

## 2019-04-14 MED ORDER — PROPOFOL 10 MG/ML IV BOLUS
INTRAVENOUS | Status: AC
  Filled 2019-04-14: qty 20

## 2019-04-14 MED ORDER — LACTATED RINGERS IV SOLN
INTRAVENOUS | Status: DC
  Administered 2019-04-14: 10:00:00 via INTRAVENOUS

## 2019-04-14 MED ORDER — SODIUM CHLORIDE 0.9 % IV SOLN: 250.0000 mL | INTRAVENOUS | Status: DC | PRN

## 2019-04-14 MED ORDER — FENTANYL CITRATE (PF) 250 MCG/5ML IJ SOLN
INTRAMUSCULAR | Status: AC
  Filled 2019-04-14: qty 5

## 2019-04-14 MED ORDER — PROPOFOL 500 MG/50ML IV EMUL
INTRAVENOUS | Status: DC | PRN
  Administered 2019-04-14: 100 ug/kg/min via INTRAVENOUS

## 2019-04-14 MED ORDER — DEXAMETHASONE SODIUM PHOSPHATE 10 MG/ML IJ SOLN
INTRAMUSCULAR | Status: DC | PRN
  Administered 2019-04-14: 8 mg via INTRAVENOUS

## 2019-04-14 MED ORDER — FENTANYL CITRATE (PF) 100 MCG/2ML IJ SOLN: 25.0000 ug | INTRAMUSCULAR | Status: DC | PRN

## 2019-04-14 MED ORDER — TRAMADOL HCL 50 MG PO TABS: 50.0000 mg | ORAL_TABLET | Freq: Four times a day (QID) | ORAL | 1 refills | Status: AC | PRN

## 2019-04-14 MED ORDER — LIDOCAINE 2% (20 MG/ML) 5 ML SYRINGE
INTRAMUSCULAR | Status: AC
  Filled 2019-04-14: qty 5

## 2019-04-14 MED ORDER — 0.9 % SODIUM CHLORIDE (POUR BTL) OPTIME
TOPICAL | Status: DC | PRN
  Administered 2019-04-14: 1000 mL

## 2019-04-14 MED ORDER — OXYCODONE HCL 5 MG PO TABS: 5.0000 mg | ORAL_TABLET | ORAL | Status: DC | PRN

## 2019-04-14 MED ORDER — SODIUM CHLORIDE 0.9% FLUSH: 3.0000 mL | Freq: Two times a day (BID) | INTRAVENOUS | Status: DC

## 2019-04-14 MED ORDER — ONDANSETRON HCL 4 MG/2ML IJ SOLN
INTRAMUSCULAR | Status: AC
  Filled 2019-04-14: qty 2

## 2019-04-14 MED ORDER — ACETAMINOPHEN 325 MG PO TABS
650.0000 mg | ORAL_TABLET | ORAL | Status: DC | PRN
  Filled 2019-04-14: qty 2

## 2019-04-14 MED ORDER — TRAMADOL HCL 50 MG PO TABS
50.0000 mg | ORAL_TABLET | Freq: Once | ORAL | Status: AC
  Administered 2019-04-14: 50 mg via ORAL

## 2019-04-14 MED ORDER — CELECOXIB 200 MG PO CAPS
200.0000 mg | ORAL_CAPSULE | ORAL | Status: AC
  Administered 2019-04-14: 200 mg via ORAL
  Filled 2019-04-14: qty 1

## 2019-04-14 MED ORDER — DEXAMETHASONE SODIUM PHOSPHATE 10 MG/ML IJ SOLN
INTRAMUSCULAR | Status: AC
  Filled 2019-04-14: qty 1

## 2019-04-14 SURGICAL SUPPLY — 40 items
APPLIER CLIP 9.375 MED OPEN (MISCELLANEOUS) ×3
BINDER BREAST LRG (GAUZE/BANDAGES/DRESSINGS) ×2 IMPLANT
BINDER BREAST XLRG (GAUZE/BANDAGES/DRESSINGS) ×2 IMPLANT
CANISTER SUCT 3000ML PPV (MISCELLANEOUS) ×3 IMPLANT
CHLORAPREP W/TINT 26 (MISCELLANEOUS) ×3 IMPLANT
CLIP APPLIE 9.375 MED OPEN (MISCELLANEOUS) ×1 IMPLANT
COVER PROBE W GEL 5X96 (DRAPES) ×3 IMPLANT
COVER SURGICAL LIGHT HANDLE (MISCELLANEOUS) ×3 IMPLANT
COVER WAND RF STERILE (DRAPES) ×3 IMPLANT
DERMABOND ADVANCED (GAUZE/BANDAGES/DRESSINGS) ×2
DERMABOND ADVANCED .7 DNX12 (GAUZE/BANDAGES/DRESSINGS) ×1 IMPLANT
DEVICE DUBIN SPECIMEN MAMMOGRA (MISCELLANEOUS) ×3 IMPLANT
DRAPE CHEST BREAST 15X10 FENES (DRAPES) ×3 IMPLANT
DRSG PAD ABDOMINAL 8X10 ST (GAUZE/BANDAGES/DRESSINGS) ×3 IMPLANT
ELECT CAUTERY BLADE 6.4 (BLADE) ×3 IMPLANT
ELECT REM PT RETURN 9FT ADLT (ELECTROSURGICAL) ×3
ELECTRODE REM PT RTRN 9FT ADLT (ELECTROSURGICAL) ×1 IMPLANT
GAUZE SPONGE 4X4 12PLY STRL (GAUZE/BANDAGES/DRESSINGS) ×2 IMPLANT
GAUZE SPONGE 4X4 12PLY STRL LF (GAUZE/BANDAGES/DRESSINGS) ×3 IMPLANT
GLOVE EUDERMIC 7 POWDERFREE (GLOVE) ×6 IMPLANT
GOWN STRL REUS W/ TWL LRG LVL3 (GOWN DISPOSABLE) ×1 IMPLANT
GOWN STRL REUS W/ TWL XL LVL3 (GOWN DISPOSABLE) ×1 IMPLANT
GOWN STRL REUS W/TWL LRG LVL3 (GOWN DISPOSABLE) ×2
GOWN STRL REUS W/TWL XL LVL3 (GOWN DISPOSABLE) ×2
ILLUMINATOR WAVEGUIDE N/F (MISCELLANEOUS) IMPLANT
KIT BASIN OR (CUSTOM PROCEDURE TRAY) ×3 IMPLANT
KIT MARKER MARGIN INK (KITS) ×3 IMPLANT
LIGHT WAVEGUIDE WIDE FLAT (MISCELLANEOUS) IMPLANT
NDL HYPO 25GX1X1/2 BEV (NEEDLE) ×1 IMPLANT
NEEDLE HYPO 25GX1X1/2 BEV (NEEDLE) ×3 IMPLANT
NS IRRIG 1000ML POUR BTL (IV SOLUTION) ×3 IMPLANT
PACK GENERAL/GYN (CUSTOM PROCEDURE TRAY) ×3 IMPLANT
PAD ABD 8X10 STRL (GAUZE/BANDAGES/DRESSINGS) ×2 IMPLANT
SPONGE LAP 4X18 RFD (DISPOSABLE) ×3 IMPLANT
SUT MNCRL AB 4-0 PS2 18 (SUTURE) ×3 IMPLANT
SUT SILK 2 0 SH (SUTURE) ×3 IMPLANT
SUT VIC AB 3-0 SH 18 (SUTURE) ×3 IMPLANT
SYR CONTROL 10ML LL (SYRINGE) ×3 IMPLANT
TOWEL GREEN STERILE (TOWEL DISPOSABLE) ×3 IMPLANT
TOWEL GREEN STERILE FF (TOWEL DISPOSABLE) ×3 IMPLANT

## 2019-04-14 NOTE — Anesthesia Preprocedure Evaluation (Addendum)
Anesthesia Evaluation  Patient identified by MRN, date of birth, ID band Patient awake    Reviewed: Allergy & Precautions, NPO status , Patient's Chart, lab work & pertinent test results  History of Anesthesia Complications (+) PONV and history of anesthetic complications  Airway Mallampati: II  TM Distance: >3 FB Neck ROM: Full    Dental no notable dental hx. (+) Teeth Intact   Pulmonary neg pulmonary ROS, former smoker,    Pulmonary exam normal breath sounds clear to auscultation       Cardiovascular hypertension, Normal cardiovascular exam Rhythm:Regular Rate:Normal  POTS   Neuro/Psych  Headaches, PSYCHIATRIC DISORDERS ADD   GI/Hepatic Neg liver ROS, Hx/o anal fissure   Endo/Other  Right Breast Fibroepithelial lesion  Renal/GU negative Renal ROS  negative genitourinary   Musculoskeletal negative musculoskeletal ROS (+)   Abdominal   Peds  Hematology negative hematology ROS (+)   Anesthesia Other Findings   Reproductive/Obstetrics                           Anesthesia Physical Anesthesia Plan  ASA: II  Anesthesia Plan: General   Post-op Pain Management:    Induction: Intravenous  PONV Risk Score and Plan: Midazolam, Ondansetron, Dexamethasone and TIVA  Airway Management Planned: LMA  Additional Equipment:   Intra-op Plan:   Post-operative Plan: Extubation in OR  Informed Consent: I have reviewed the patients History and Physical, chart, labs and discussed the procedure including the risks, benefits and alternatives for the proposed anesthesia with the patient or authorized representative who has indicated his/her understanding and acceptance.     Dental advisory given  Plan Discussed with: CRNA and Surgeon  Anesthesia Plan Comments:        Anesthesia Quick Evaluation

## 2019-04-14 NOTE — Anesthesia Procedure Notes (Signed)
Procedure Name: LMA Insertion °Performed by: Cacey Willow H, CRNA °Pre-anesthesia Checklist: Patient identified, Emergency Drugs available, Suction available and Patient being monitored °Patient Re-evaluated:Patient Re-evaluated prior to induction °Oxygen Delivery Method: Circle System Utilized °Preoxygenation: Pre-oxygenation with 100% oxygen °Induction Type: IV induction °Ventilation: Mask ventilation without difficulty °LMA: LMA inserted °LMA Size: 4.0 °Number of attempts: 1 °Airway Equipment and Method: Bite block °Placement Confirmation: positive ETCO2 °Tube secured with: Tape °Dental Injury: Teeth and Oropharynx as per pre-operative assessment  ° ° ° ° ° ° °

## 2019-04-14 NOTE — Transfer of Care (Signed)
Immediate Anesthesia Transfer of Care Note  Patient: Linda Whitney  Procedure(s) Performed: RIGHT BREAST LUMPECTOMY WITH RADIOACTIVE SEED LOCALIZATION (Right Breast)  Patient Location: PACU  Anesthesia Type:General  Level of Consciousness: drowsy  Airway & Oxygen Therapy: Patient Spontanous Breathing and Patient connected to nasal cannula oxygen  Post-op Assessment: Report given to RN and Post -op Vital signs reviewed and stable  Post vital signs: Reviewed and stable  Last Vitals:  Vitals Value Taken Time  BP 105/77 04/14/19 1211  Temp    Pulse 91 04/14/19 1212  Resp 17 04/14/19 1212  SpO2 100 % 04/14/19 1212  Vitals shown include unvalidated device data.  Last Pain:  Vitals:   04/14/19 1024  TempSrc:   PainSc: 0-No pain      Patients Stated Pain Goal: 0 (29/93/71 6967)  Complications: No apparent anesthesia complications

## 2019-04-14 NOTE — Interval H&P Note (Signed)
History and Physical Interval Note:  04/14/2019 10:32 AM  Linda Whitney  has presented today for surgery, with the diagnosis of RIGHT BREAST FIBROEPITHELIAL LESION.  The various methods of treatment have been discussed with the patient and family. After consideration of risks, benefits and other options for treatment, the patient has consented to  Procedure(s): RIGHT BREAST LUMPECTOMY WITH RADIOACTIVE SEED LOCALIZATION (Right) as a surgical intervention.  The patient's history has been reviewed, patient examined, no change in status, stable for surgery.  I have reviewed the patient's chart and labs.  Questions were answered to the patient's satisfaction.     [name]

## 2019-04-14 NOTE — Op Note (Signed)
Patient Name:           Linda Whitney   Date of Surgery:        04/14/2019  Pre op Diagnosis:      Fibroepithelial lesion right breast, lower inner quadrant  Post op Diagnosis:    Same  Procedure:                 Right breast lumpectomy with radioactive seed localization and margin assessment  Surgeon:                     Edsel Petrin. Dalbert Batman, M.D., FACS  Assistant:                      Or staff  Operative Indications:   This is a pleasant 43 year old female, referred by Lajean Manes at the [facility] for evaluation of a fibroepithelial lesion of the right breast, lower inner quadrant. Junie Spencer, PA provides primary care.      She's been followed for some time because of stable bilateral benign masses. She did have a core needle biopsy of her left breast a year ago which was benign There is a benign-appearing mass in the right breast at the 4 o'clock position 2 cm from the nipple. This has enlarged in size from 1.8-2.1 cm. Axillary ultrasound negative. Because of increasing size she underwent core biopsy and that she has fibroepithelial lesion. Differential includes fibroadenoma versus phyllodes tumor. She was referred for consideration of excision which is appropriate.      Family history is negative for breast or ovarian cancer. Mother died of recurrent colorectal cancer.      I discussed the physical findings, imaging findings, and pathology report. She understands the differential diagnosis. She wants the lesion removed. I think we can do this through a conservative incision, hopefully circumareolar. She agrees with this plan.  Operative Findings:       The radioactive seed was deep to the right areolar margin, lower inner quadrant, approximately 4 o'clock position.  This allowed a circumareolar, hidden scar incision.  Once the lesion was removed it felt like a fibroadenoma.  Specimen mammogram looked good showing the original biopsy clip and the radioactive seed in the center of the  specimen.  Procedure in Detail:          Following the induction of general LMA anesthesia the patient's right breast was prepped and draped in a sterile fashion.  Surgical timeout was performed.  Intravenous antibiotics were given.  0.25% Marcaine with epinephrine was used as a local infiltration anesthetic.     The neoprobe was used and isolated the seed.  A curvilinear incision was made at the areolar margin lower inner quadrant.  Lumpectomy was performed using the neoprobe and electrocautery.  The specimen was removed and marked with silk sutures and a 6 color ink kit.  The specimen mammogram looked good as described above.  Specimen was sent to the lab where the seed was retrieved.  The wound was irrigated.  Hemostasis was excellent.  I placed 5 metal marker clips in the walls of the lumpectomy cavity.  The breast tissues were closed in several layers with interrupted sutures of 3-0 Vicryl and the skin closed with a running subcuticular 4-0 Monocryl and Dermabond.     Dry bandages and a breast binder were placed.  The patient tolerated the procedure well and was taken to PACU in stable condition.  EBL 10 cc.  Counts correct.  Complications none.    Addendum: I logged onto the PMP aware website and reviewed her prescription medication history     Ayme Short M. Dalbert Batman, M.D., FACS General and Minimally Invasive Surgery Breast and Colorectal Surgery  04/14/2019 12:08 PM

## 2019-04-14 NOTE — OR Nursing (Signed)
Dr. Royce Macadamia called, ok to give Tramadol 50 mg po.

## 2019-04-14 NOTE — Discharge Instructions (Signed)
[facility] °Office Phone Number [phone] ° °BREAST BIOPSY/ PARTIAL MASTECTOMY: POST OP INSTRUCTIONS ° °Always review your discharge instruction sheet given to you by the facility where your surgery was performed. ° °IF YOU HAVE DISABILITY OR FAMILY LEAVE FORMS, YOU MUST BRING THEM TO THE OFFICE FOR PROCESSING.  DO NOT GIVE THEM TO YOUR DOCTOR. ° °1. A prescription for pain medication may be given to you upon discharge.  Take your pain medication as prescribed, if needed.  If narcotic pain medicine is not needed, then you may take acetaminophen (Tylenol) or ibuprofen (Advil) as needed. °2. Take your usually prescribed medications unless otherwise directed °3. If you need a refill on your pain medication, please contact your pharmacy.  They will contact our office to request authorization.  Prescriptions will not be filled after 5pm or on week-ends. °4. You should eat very light the first 24 hours after surgery, such as soup, crackers, pudding, etc.  Resume your normal diet the day after surgery. °5. Most patients will experience some swelling and bruising in the breast.  Ice packs and a good support bra will help.  Swelling and bruising can take several days to resolve.  °6. It is common to experience some constipation if taking pain medication after surgery.  Increasing fluid intake and taking a stool softener will usually help or prevent this problem from occurring.  A mild laxative (Milk of Magnesia or Miralax) should be taken according to package directions if there are no bowel movements after 48 hours. °7. Unless discharge instructions indicate otherwise, you may remove your bandages 24-48 hours after surgery, and you may shower at that time.  You may have steri-strips (small skin tapes) in place directly over the incision.  These strips should be left on the skin for 7-10 days.  If your surgeon used skin glue on the incision, you may shower in 24 hours.  The glue will flake off over the  next 2-3 weeks.  Any sutures or staples will be removed at the office during your follow-up visit. °8. ACTIVITIES:  You may resume regular daily activities (gradually increasing) beginning the next day.  Wearing a good support bra or sports bra minimizes pain and swelling.  You may have sexual intercourse when it is comfortable. °a. You may drive when you no longer are taking prescription pain medication, you can comfortably wear a seatbelt, and you can safely maneuver your car and apply brakes. °b. RETURN TO WORK:  ______________________________________________________________________________________ °9. You should see your doctor in the office for a follow-up appointment approximately two weeks after your surgery.  Your doctor’s nurse will typically make your follow-up appointment when she calls you with your pathology report.  Expect your pathology report 2-3 business days after your surgery.  You may call to check if you do not hear from us after three days. °10. OTHER INSTRUCTIONS: _______________________________________________________________________________________________ _____________________________________________________________________________________________________________________________________ °_____________________________________________________________________________________________________________________________________ °_____________________________________________________________________________________________________________________________________ ° °WHEN TO CALL YOUR DOCTOR: °1. Fever over 101.0 °2. Nausea and/or vomiting. °3. Extreme swelling or bruising. °4. Continued bleeding from incision. °5. Increased pain, redness, or drainage from the incision. ° °The clinic staff is available to answer your questions during regular business hours.  Please don’t hesitate to call and ask to speak to one of the nurses for clinical concerns.  If you have a medical emergency, go to the nearest  emergency room or call 911.  A surgeon from [facility] is always on call at the hospital. ° °For further questions, please visit [email or web address]  ° ° ° ° ° ° ° ° ° ° °••••••••• ° ° °  Managing Your Pain After Surgery Without Opioids ° ° ° °Thank you for participating in our program to help patients manage their pain after surgery without opioids. This is part of our effort to provide you with the best care possible, without exposing you or your family to the risk that opioids pose. ° °What pain can I expect after surgery? °You can expect to have some pain after surgery. This is normal. The pain is typically worse the day after surgery, and quickly begins to get better. °Many studies have found that many patients are able to manage their pain after surgery with Over-the-Counter (OTC) medications such as Tylenol and Motrin. If you have a condition that does not allow you to take Tylenol or Motrin, notify your surgical team. ° °How will I manage my pain? °The best strategy for controlling your pain after surgery is around the clock pain control with Tylenol (acetaminophen) and Motrin (ibuprofen or Advil). Alternating these medications with each other allows you to maximize your pain control. In addition to Tylenol and Motrin, you can use heating pads or ice packs on your incisions to help reduce your pain. ° °How will I alternate your regular strength over-the-counter pain medication? °You will take a dose of pain medication every three hours. °; Start by taking 650 mg of Tylenol (2 pills of 325 mg) °; 3 hours later take 600 mg of Motrin (3 pills of 200 mg) °; 3 hours after taking the Motrin take 650 mg of Tylenol °; 3 hours after that take 600 mg of Motrin. ° ° °- 1 - ° °See example - if your first dose of Tylenol is at 12:00 PM ° ° °12:00 PM Tylenol 650 mg (2 pills of 325 mg)  °3:00 PM Motrin 600 mg (3 pills of 200 mg)  °6:00 PM Tylenol 650 mg (2 pills of 325 mg)  °9:00 PM Motrin 600 mg (3  pills of 200 mg)  °Continue alternating every 3 hours  ° °We recommend that you follow this schedule around-the-clock for at least 3 days after surgery, or until you feel that it is no longer needed. Use the table on the last page of this handout to keep track of the medications you are taking. °Important: °Do not take more than 3000mg of Tylenol or 3200mg of Motrin in a 24-hour period. °Do not take ibuprofen/Motrin if you have a history of bleeding stomach ulcers, severe kidney disease, &/or actively taking a blood thinner ° °What if I still have pain? °If you have pain that is not controlled with the over-the-counter pain medications (Tylenol and Motrin or Advil) you might have what we call “breakthrough” pain. You will receive a prescription for a small amount of an opioid pain medication such as Oxycodone, Tramadol, or Tylenol with Codeine. Use these opioid pills in the first 24 hours after surgery if you have breakthrough pain. Do not take more than 1 pill every 4-6 hours. ° °If you still have uncontrolled pain after using all opioid pills, don't hesitate to call our staff using the number provided. We will help make sure you are managing your pain in the best way possible, and if necessary, we can provide a prescription for additional pain medication. ° ° °Day 1   ° °Time  °Name of Medication Number of pills taken  °Amount of Acetaminophen  °Pain Level  ° °Comments  °AM PM       °AM PM       °AM PM       °  AM PM       °AM PM       °AM PM       °AM PM       °AM PM       °Total Daily amount of Acetaminophen °Do not take more than  3,000 mg per day    ° ° °Day 2   ° °Time  °Name of Medication Number of pills °taken  °Amount of Acetaminophen  °Pain Level  ° °Comments  °AM PM       °AM PM       °AM PM       °AM PM       °AM PM       °AM PM       °AM PM       °AM PM       °Total Daily amount of Acetaminophen °Do not take more than  3,000 mg per day    ° ° °Day 3   ° °Time  °Name of Medication Number of pills taken    °Amount of Acetaminophen  °Pain Level  ° °Comments  °AM PM       °AM PM       °AM PM       °AM PM       ° ° ° °AM PM       °AM PM       °AM PM       °AM PM       °Total Daily amount of Acetaminophen °Do not take more than  3,000 mg per day    ° ° °Day 4   ° °Time  °Name of Medication Number of pills taken  °Amount of Acetaminophen  °Pain Level  ° °Comments  °AM PM       °AM PM       °AM PM       °AM PM       °AM PM       °AM PM       °AM PM       °AM PM       °Total Daily amount of Acetaminophen °Do not take more than  3,000 mg per day    ° ° °Day 5   ° °Time  °Name of Medication Number °of pills taken  °Amount of Acetaminophen  °Pain Level  ° °Comments  °AM PM       °AM PM       °AM PM       °AM PM       °AM PM       °AM PM       °AM PM       °AM PM       °Total Daily amount of Acetaminophen °Do not take more than  3,000 mg per day    ° ° ° °Day 6   ° °Time  °Name of Medication Number of pills °taken  °Amount of Acetaminophen  °Pain Level  °Comments  °AM PM       °AM PM       °AM PM       °AM PM       °AM PM       °AM PM       °AM PM       °AM PM       °Total Daily amount of Acetaminophen °Do not take more than    3,000 mg per day    ° ° °Day 7   ° °Time  °Name of Medication Number of pills taken  °Amount of Acetaminophen  °Pain Level  ° °Comments  °AM PM       °AM PM       °AM PM       °AM PM       °AM PM       °AM PM       °AM PM       °AM PM       °Total Daily amount of Acetaminophen °Do not take more than  3,000 mg per day    ° ° ° ° °For additional information about how and where to safely dispose of unused opioid °medications - https://www.[employer]ful[employer].org ° °Disclaimer: This document contains information and/or instructional materials adapted from [facility] for the typical patient with your condition. It does not replace medical advice from your health care provider because your experience may differ from that of the °typical patient. Talk to your health care provider if you have any questions about  this °document, your condition or your treatment plan. °Adapted from [facility] ° °

## 2019-04-14 NOTE — Anesthesia Postprocedure Evaluation (Signed)
Anesthesia Post Note  Patient: Linda Whitney  Procedure(s) Performed: RIGHT BREAST LUMPECTOMY WITH RADIOACTIVE SEED LOCALIZATION (Right Breast)     Patient location during evaluation: PACU Anesthesia Type: General Level of consciousness: awake and alert and oriented Pain management: pain level controlled Vital Signs Assessment: post-procedure vital signs reviewed and stable Respiratory status: spontaneous breathing, nonlabored ventilation and respiratory function stable Cardiovascular status: blood pressure returned to baseline and stable Postop Assessment: no apparent nausea or vomiting Anesthetic complications: no    Last Vitals:  Vitals:   04/14/19 1210 04/14/19 1225  BP:  101/86  Pulse:  78  Resp:  15  Temp: 36.6 C   SpO2:  100%    Last Pain:  Vitals:   04/14/19 1225  TempSrc:   PainSc: 5                  Yazhini Mcaulay A.

## 2019-04-15 ENCOUNTER — Encounter (HOSPITAL_COMMUNITY): Payer: Self-pay | Admitting: General Surgery

## 2019-04-17 NOTE — Progress Notes (Signed)
Inform patient of Pathology report,. Breast pathology shows fibroadenoma, completely benign. I will discuss in detail at next [place]

## 2019-08-23 IMAGING — US ULTRASOUND RIGHT BREAST LIMITED
1 series · 12 of 18 positions shown · non-contrast
Comparison: 12/29/2017

CLINICAL DATA: Screening recall for possible masses in each breast.
Screening study was the baseline exam.

EXAM:
DIGITAL DIAGNOSTIC BILATERAL MAMMOGRAM WITH CAD AND TOMO
ULTRASOUND BILATERAL BREAST

[Series 1: ultrasound right breast limited · 0.06mm/px · 12 of 18 slices shown]
[im 1/18]
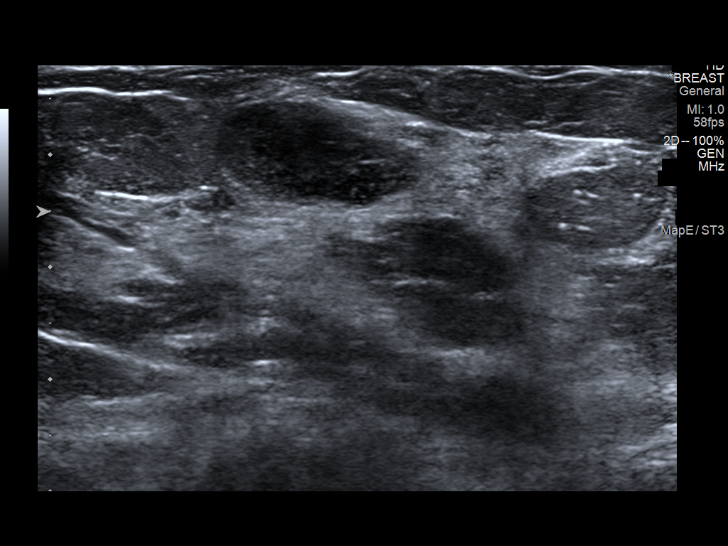
[im 3/18]
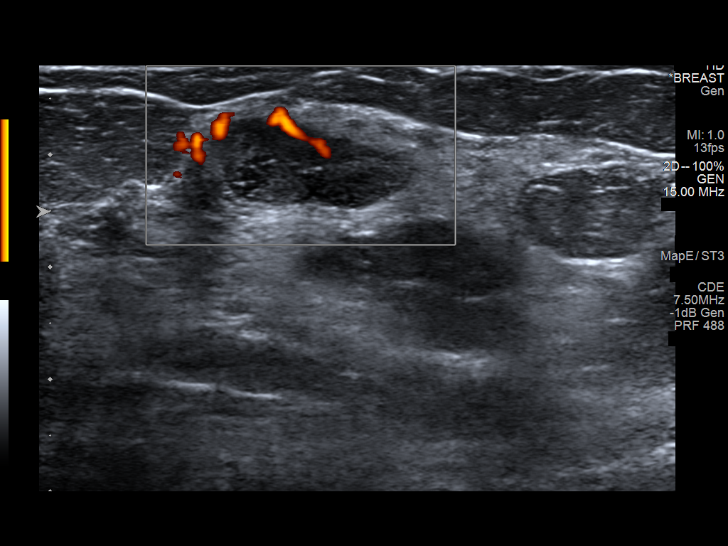
[im 4/18]
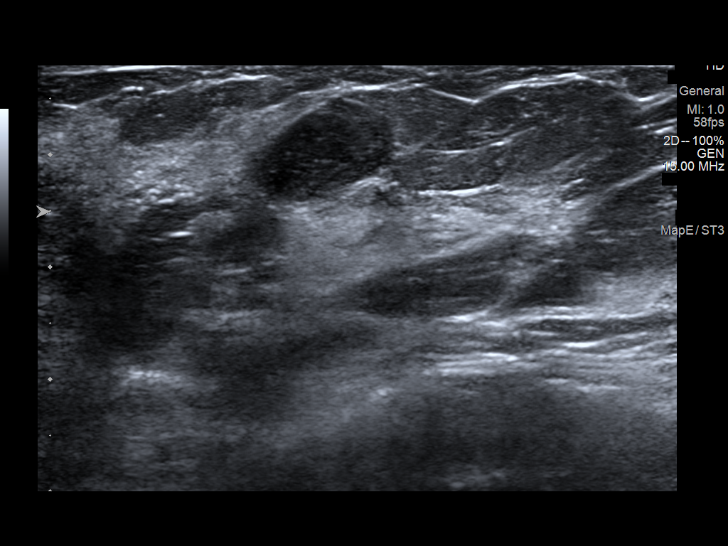
[im 6/18]
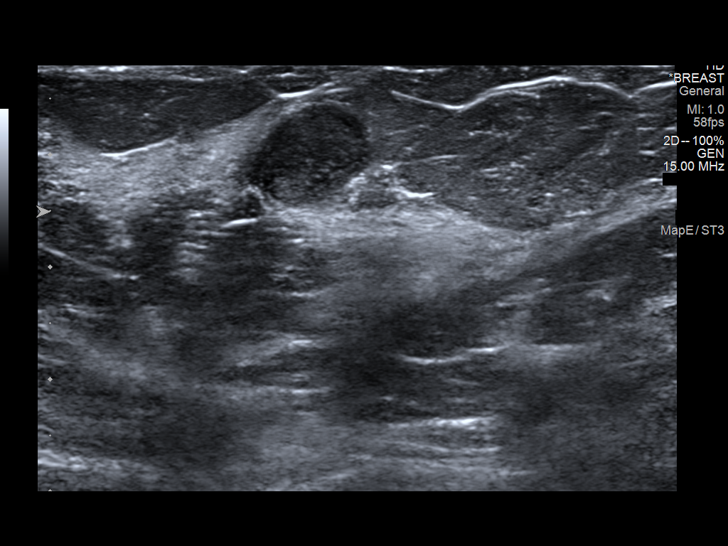
[im 7/18]
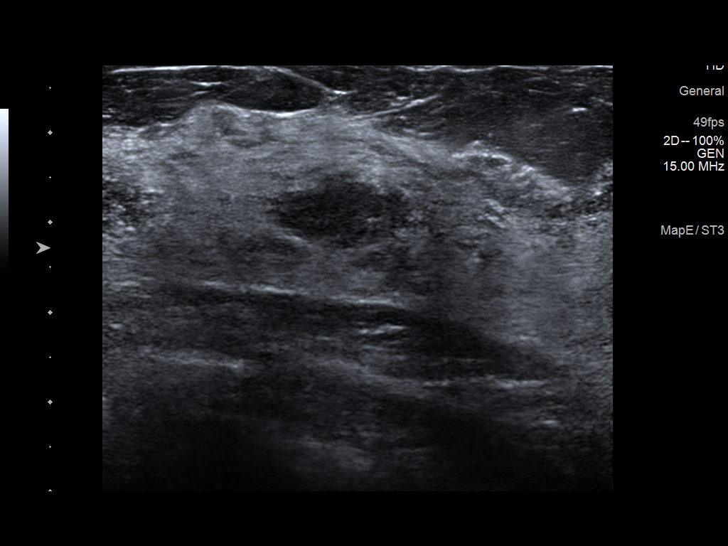
[im 9/18]
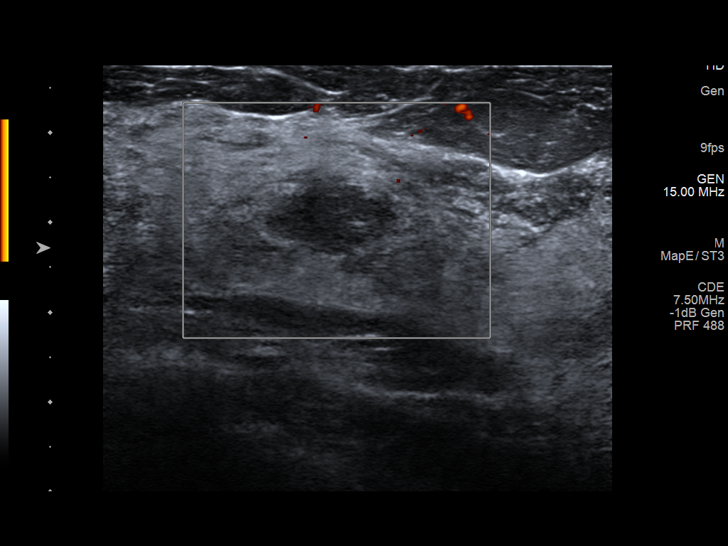
[im 10/18]
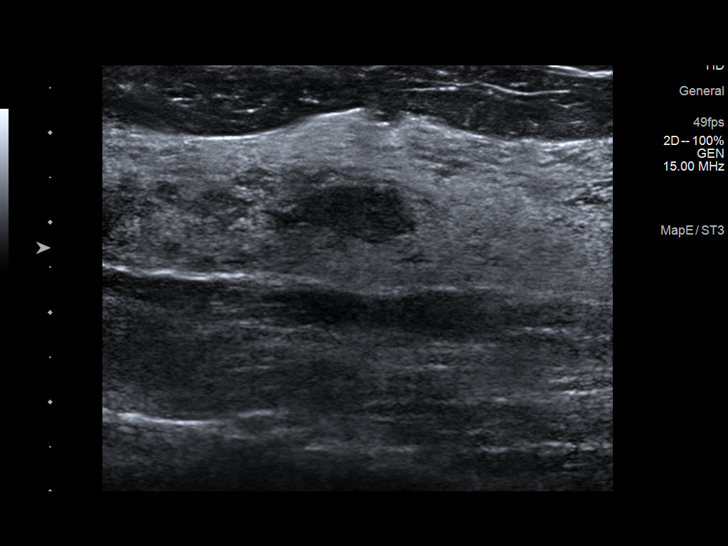
[im 12/18]
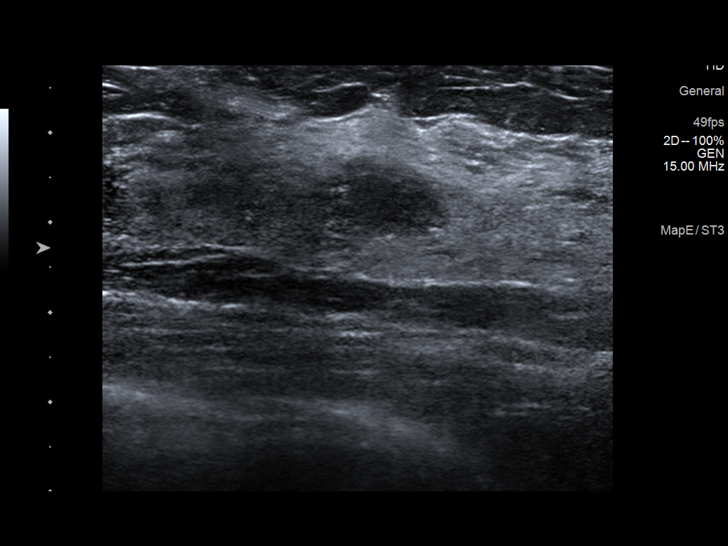
[im 13/18]
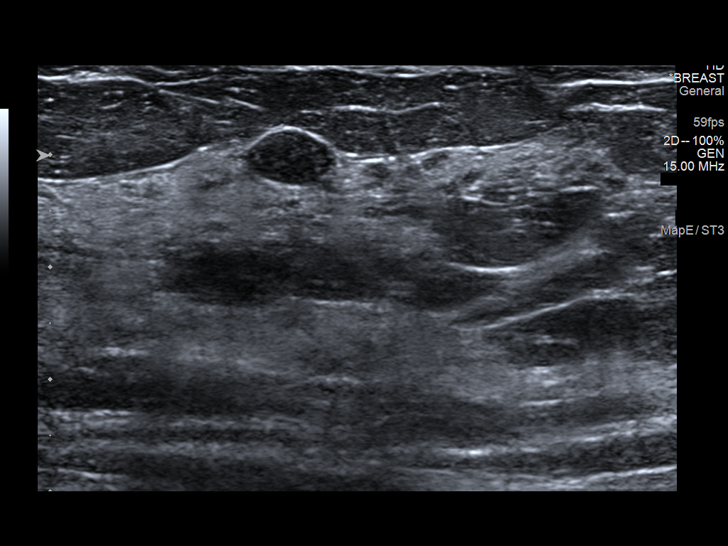
[im 15/18]
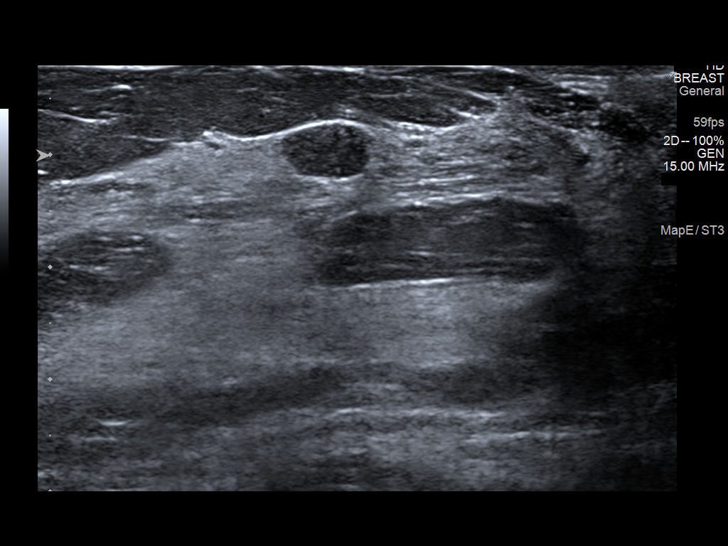
[im 16/18]
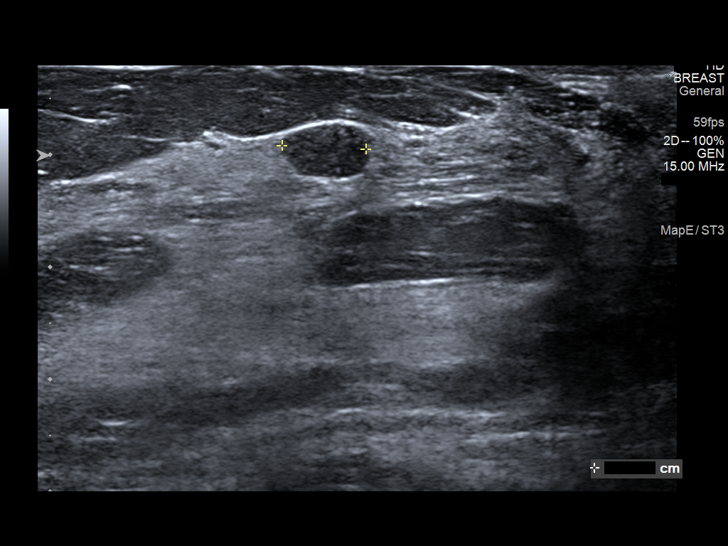
[im 18/18]
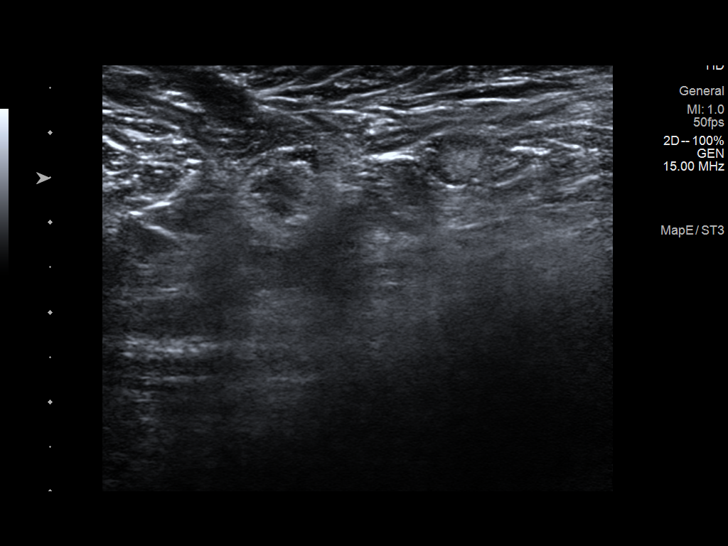

[12 of 18 positions shown; findings below may reference images not displayed]

ACR Breast Density Category c: The breast tissue is heterogeneously
dense, which may obscure small masses.
FINDINGS: On the right, the questionable mass in the upper outer aspect of the
right breast disperses. There is no convincing underlying mass on
the diagnostic spot-compression images. In the lower inner aspect of
the right breast, possible mass persists. It is oval and
circumscribed.

On the left, the possible mass in the outer quadrant, posteriorly,
persists as a partly circumscribed oval mass. Anterior to this, also
in the upper outer quadrant, the second mass also persists. It is
oval and circumscribed.

There are no areas of architectural distortion on either side. No
suspicious calcifications.

Mammographic images were processed with CAD.

On physical exam, the patient has a lumpy fibroglandular
architecture bilaterally. The larger mass in the upper outer
quadrant of the left breast is not definitively palpated. There are
no discrete palpable masses in either breast.

Targeted right breast ultrasound is performed, showing a hypoechoic
circumscribed oval mass, oriented parallel to the skin, at 4
o'clock, 2 cm from the nipple, measuring 18 x 9 x 13 mm, reflecting
the lower inner quadrant mass seen mammographically. In the upper
outer quadrant there is a vague hypoechoic mass in the 10 o'clock
position, 6 cm the knee, measuring approximately 16 x 7 x 12 mm.
This could reflect a island of fat or additional small solid mass.
In the 12 o'clock position of the right breast, retroareolar region,
there is a small oval circumscribed hypoechoic mass measuring 7 x 5
x 8 mm.

Targeted ultrasound is performed, showing a heterogeneous, primarily
hypoechoic mass with partly circumscribed margins in the left breast
at 1 o'clock, 8 cm the nipple, measuring 4 x 1.9 x 2.4 cm,
reflecting the larger upper-outer quadrant mass seen
mammographically. Anterior to this, at 1 o'clock, 2 cm the nipple,
there is an oval circumscribed hypoechoic mass measuring 15 x 7 x 13
mm, reflecting the smaller mass seen mammographically.

Sonographic evaluation of both axilla shows no enlarged or abnormal
lymph nodes.
IMPRESSION: 1. Bilateral solid breast masses. The largest most complicated of
these lies in the posterior upper outer quadrant of the left breast
at 1 o'clock, 8 cm the nipple. Tissue sampling of this mass is
recommended.

RECOMMENDATION:
1. Ultrasound-guided core needle biopsy of the mass at 1 o'clock, 8
cm the nipple, in the left breast. If this reveals a benign mass,
specifically a fibroadenoma, then short-term follow-up for the
remaining masses in each breast would be recommended. If pathology
reveals carcinoma, the other masses may warrant tissue sampling.

I have discussed the findings and recommendations with the patient.
Results were also provided in writing at the conclusion of the
visit. If applicable, a reminder letter will be sent to the patient
regarding the next appointment.

BI-RADS CATEGORY  4: Suspicious.

## 2019-08-27 IMAGING — MG MM CLIP PLACEMENT
2 series · 2 of 2 positions shown · non-contrast
Comparison: Previous exam(s).

CLINICAL DATA: Patient is post ultrasound-guided core needle biopsy
of a 4 cm mass over the 1 o'clock position left breast.

EXAM:
DIAGNOSTIC LEFT MAMMOGRAM POST ULTRASOUND BIOPSY

[L CC]
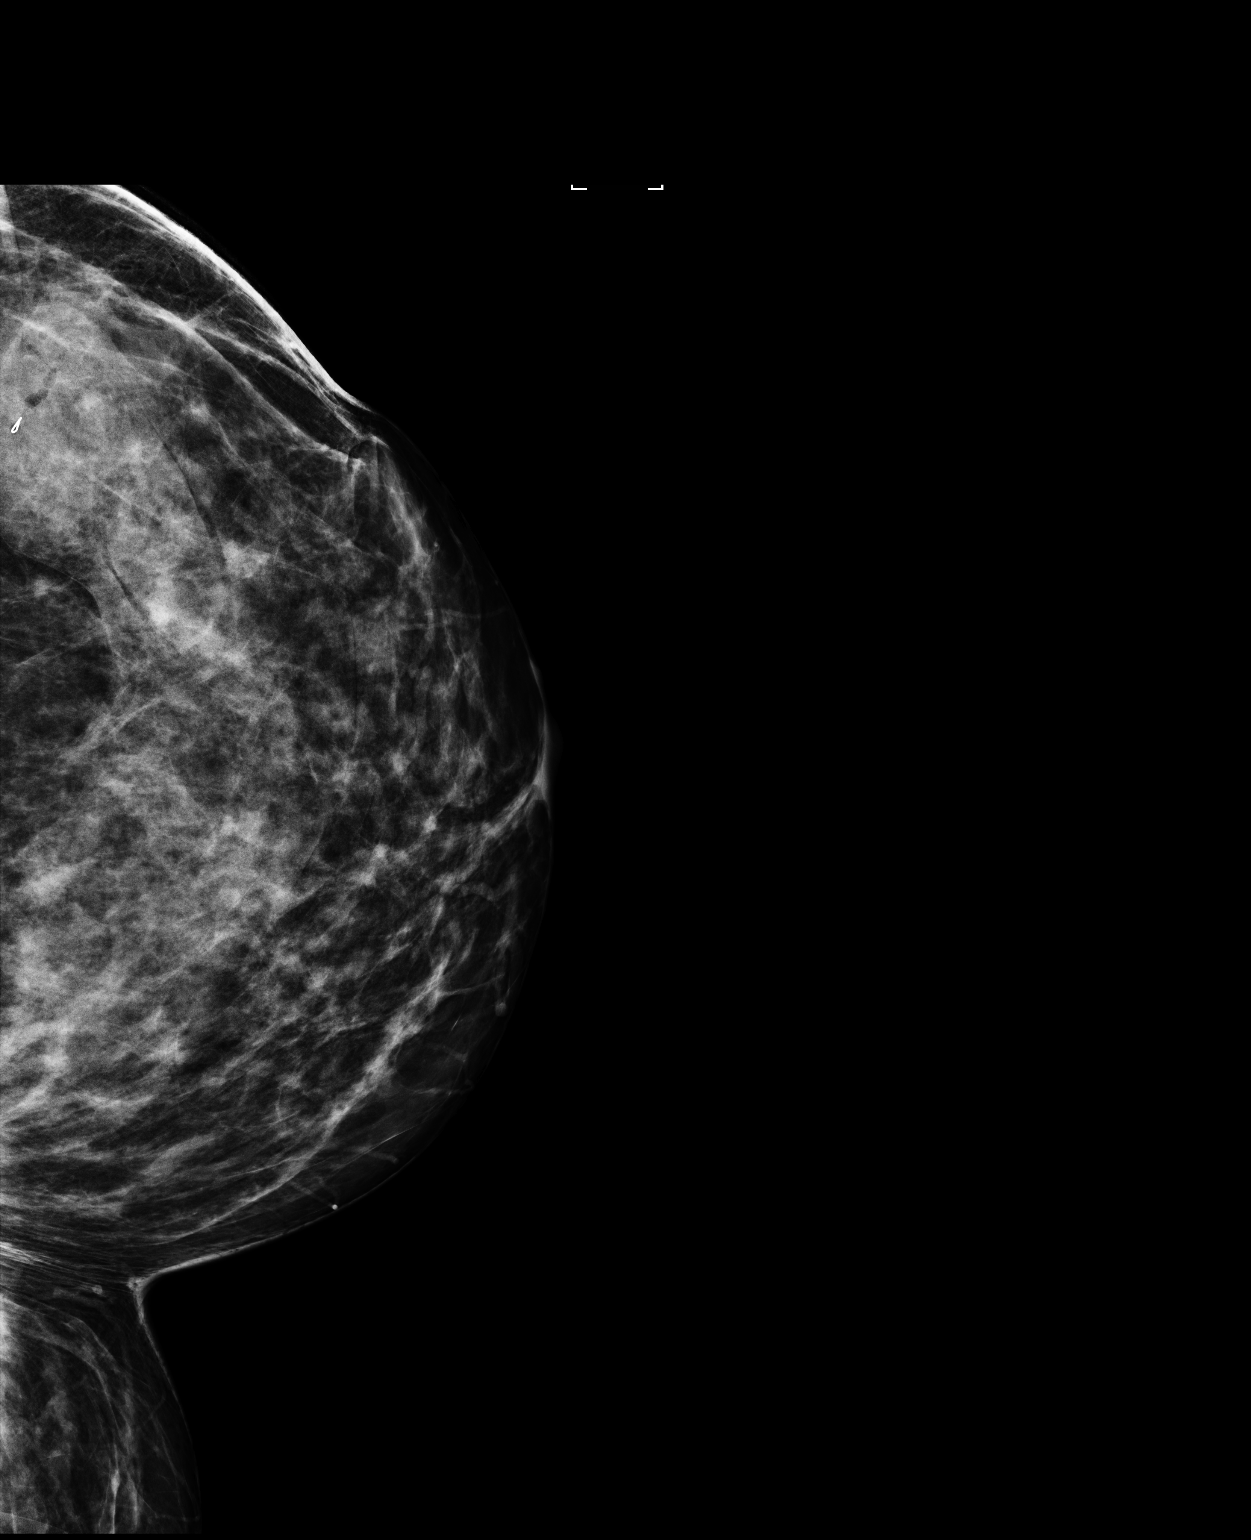

[L ML]
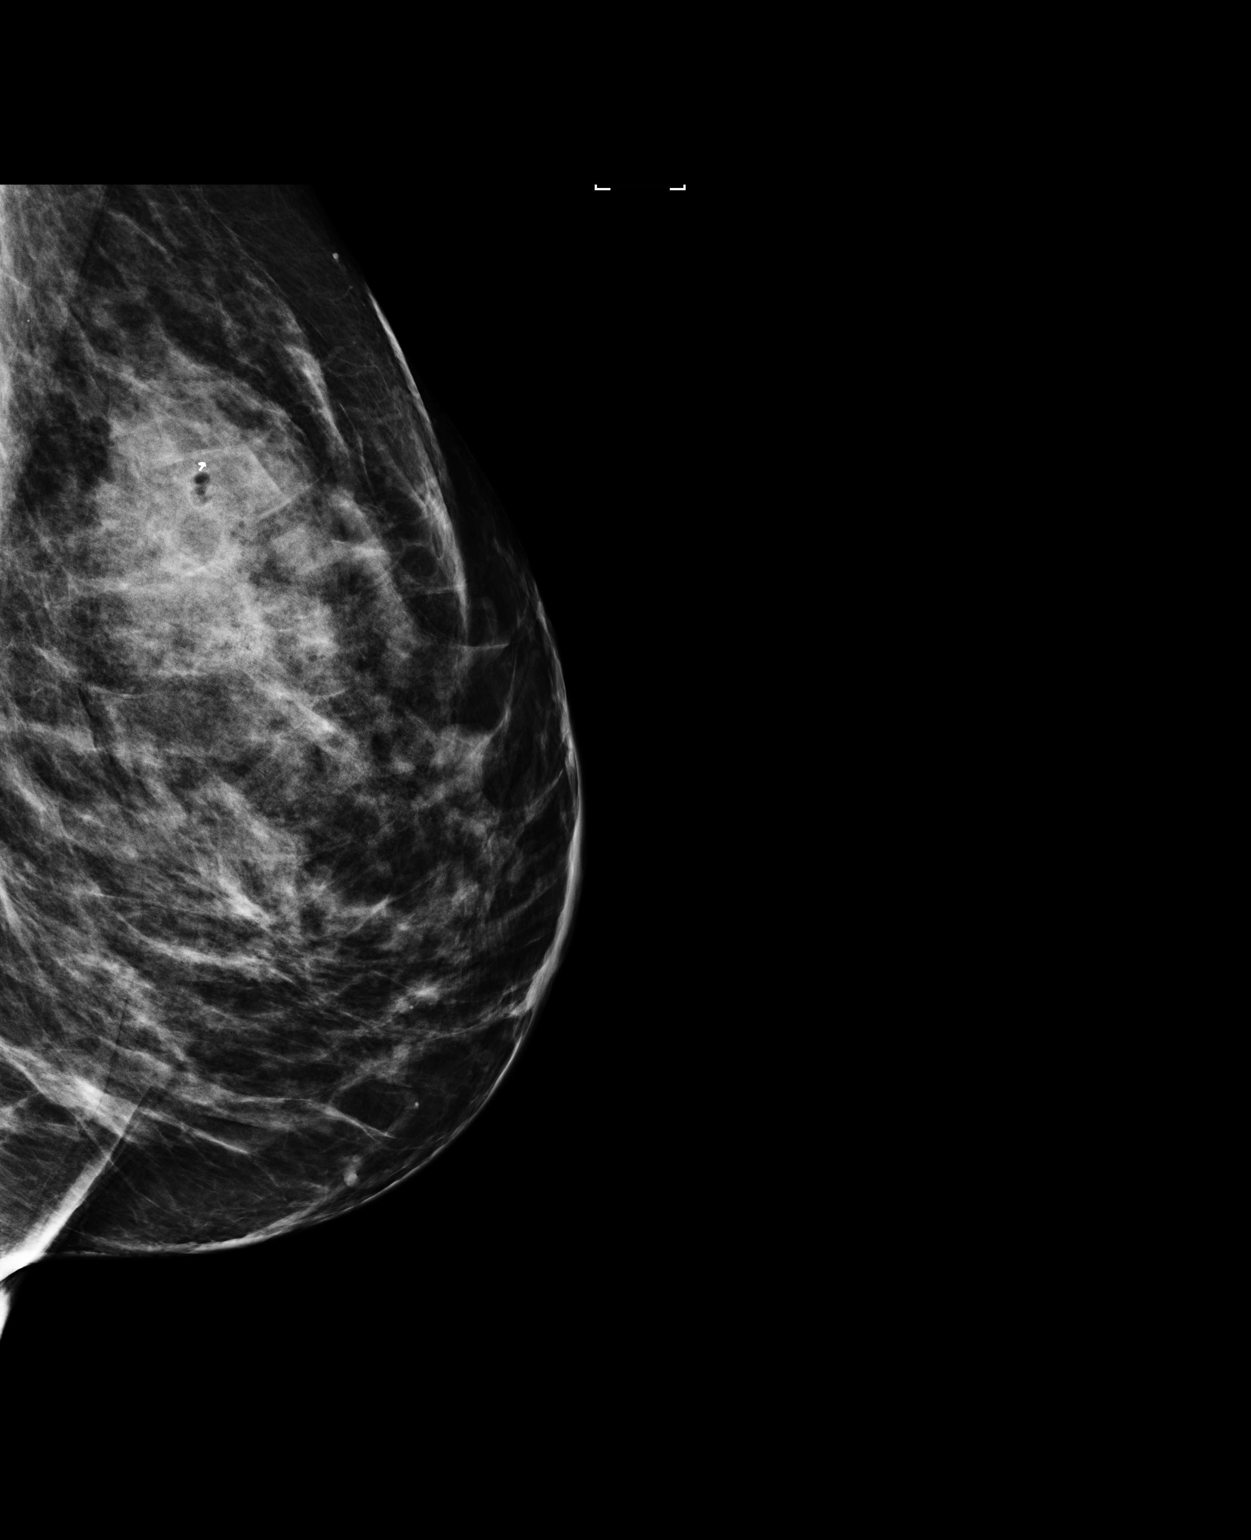

[2 of 2 positions shown; findings below may reference images not displayed]

FINDINGS: Mammographic images were obtained following ultrasound guided biopsy
of the targeted mass over the 1 o'clock position. Images
demonstrates satisfactory placement of a ribbon shaped metallic clip
over the biopsied mass.
IMPRESSION: Satisfactory clip placement post ultrasound-guided core biopsy left
breast mass.

Final Assessment: Post Procedure Mammograms for Marker Placement

## 2019-08-27 IMAGING — US US BREAST BX W LOC DEV 1ST LESION IMG BX SPEC US GUIDE*L*
1 series · 12 of 12 positions shown · non-contrast
Comparison: Previous exam(s).

ADDENDUM:
Pathology revealed PSEUDOANGIOMATOUS STROMAL HYPERPLASIA (PASH) of
the Left breast, 1 o'clock, 0cmfn. This was found to be concordant
by Dr. Semseddi Firadzade.

Pathology results were discussed with the patient by telephone. The
patient reported doing well after the biopsy with tenderness at the
site. Post biopsy instructions and care were reviewed and questions
were answered. The patient was encouraged to call The [REDACTED]
The patient was asked to return for bilateral diagnostic mammography
and ultrasound in 6 months and informed a reminder notice would be
sent regarding this appointment.
Pathology results reported by Erik Jonsbak Otte, RN on 01/12/2018.
CLINICAL DATA: Patient presents for ultrasound-guided core needle
biopsy of a 4 cm oval heterogeneous mass over the 1 o'clock position
left breast 8 cm from nipple.
EXAM:
ULTRASOUND GUIDED LEFT BREAST CORE NEEDLE BIOPSY

[Series 1: us breast bx w loc dev 1st lesion img bx spec us g · 0.07mm/px · 12 of 12 slices shown]
[im 1/12]
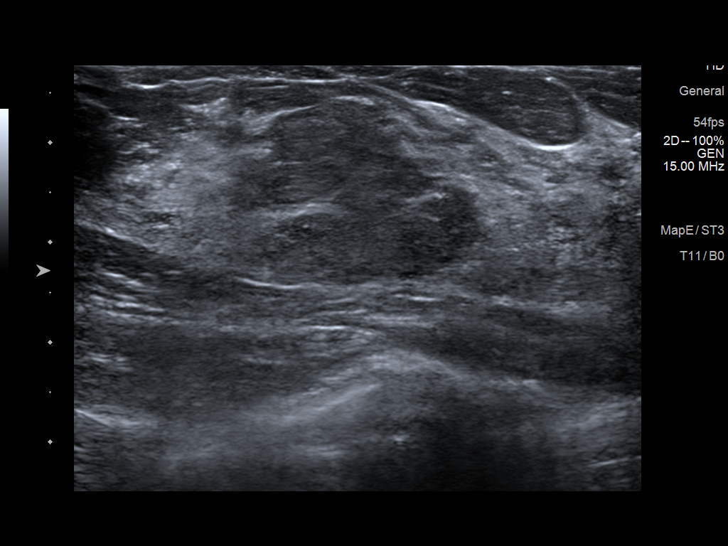
[im 2/12]
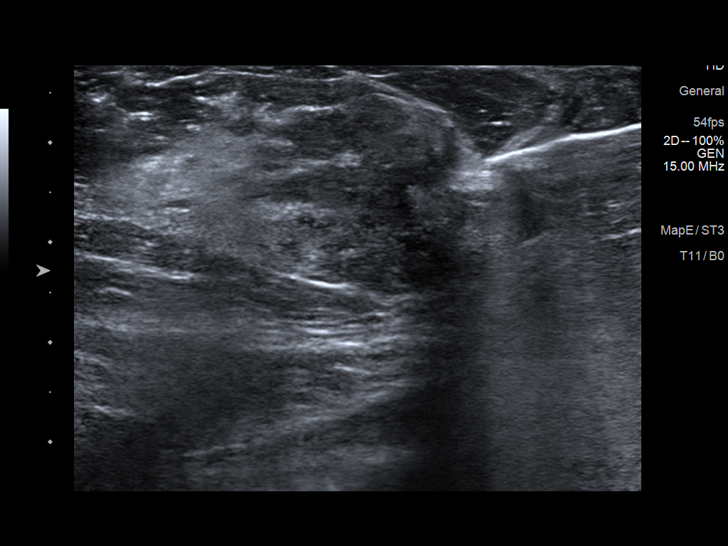
[im 3/12]
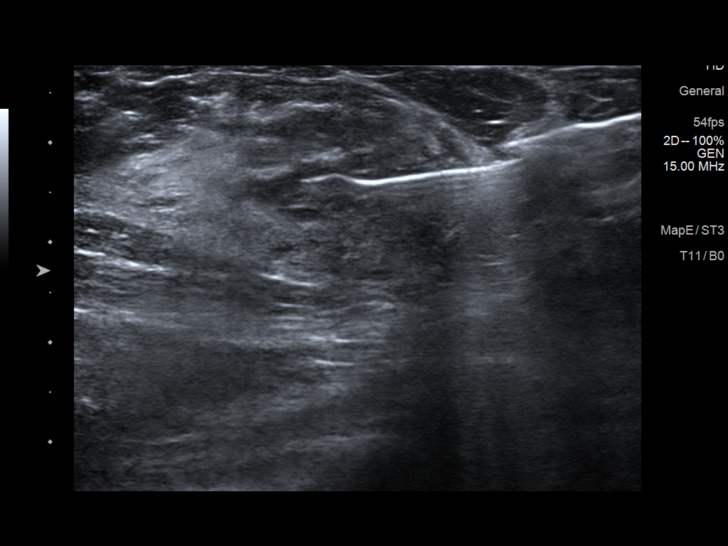
[im 4/12]
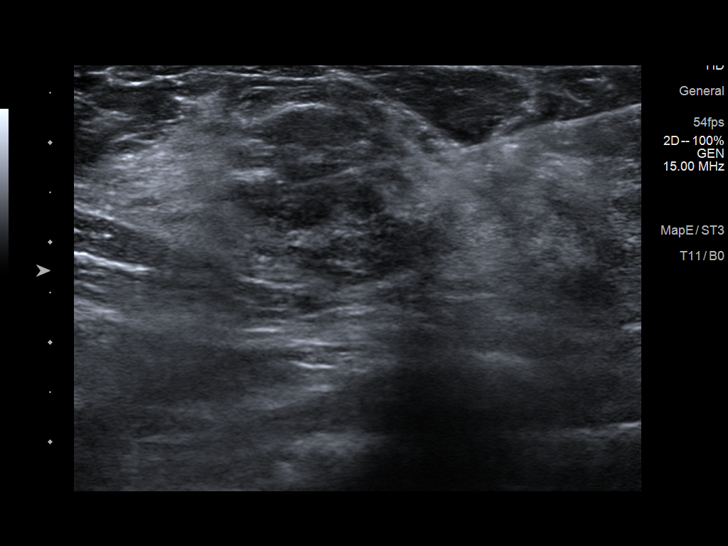
[im 5/12]
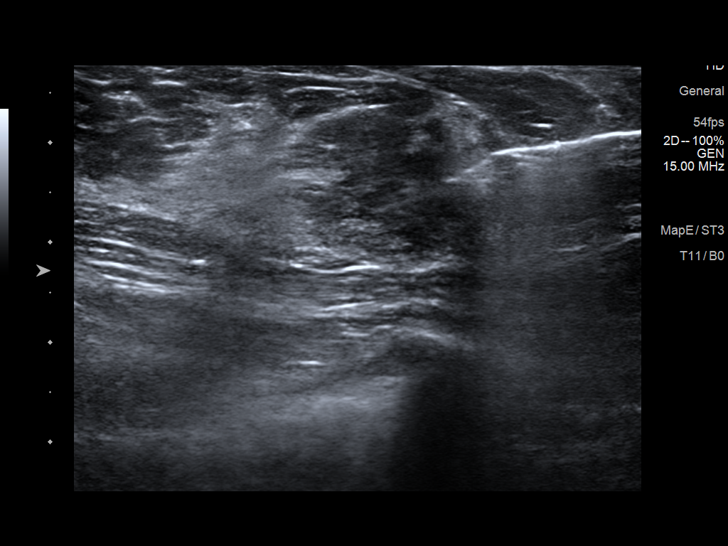
[im 6/12]
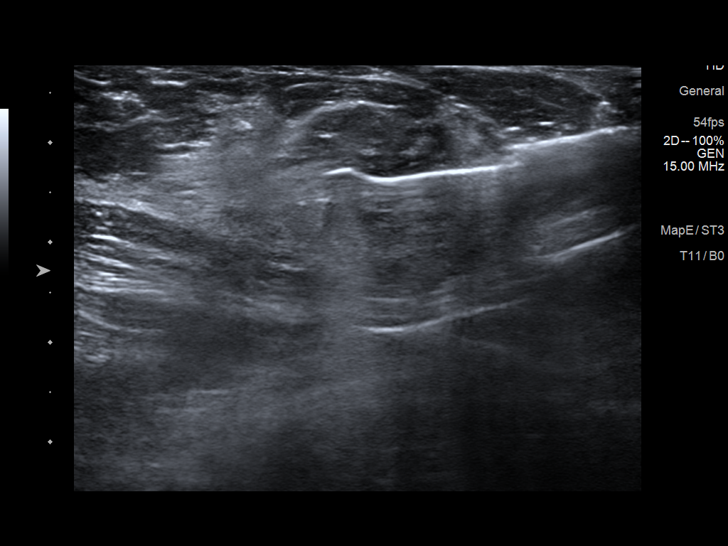
[im 7/12]
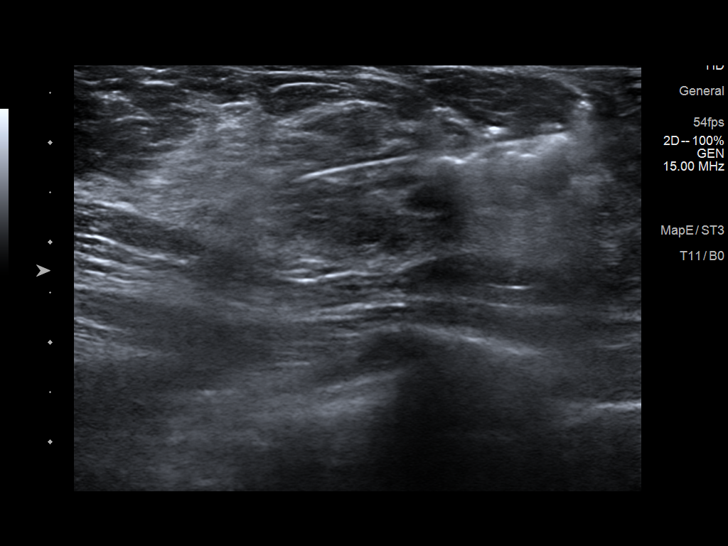
[im 8/12]
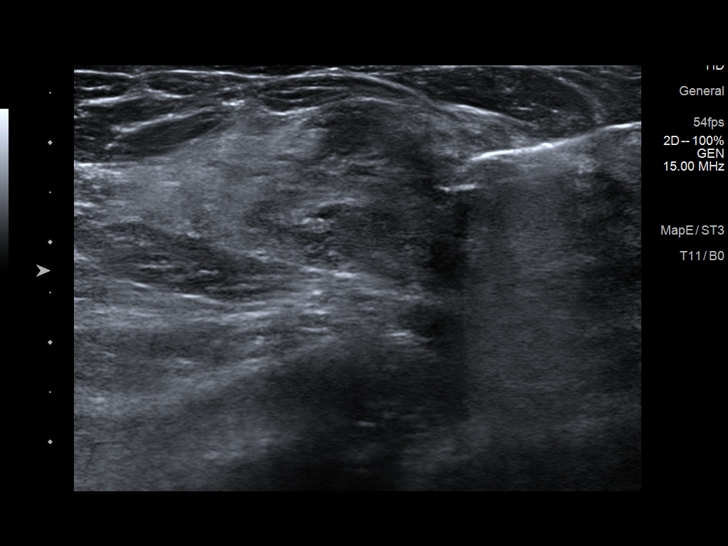
[im 9/12]
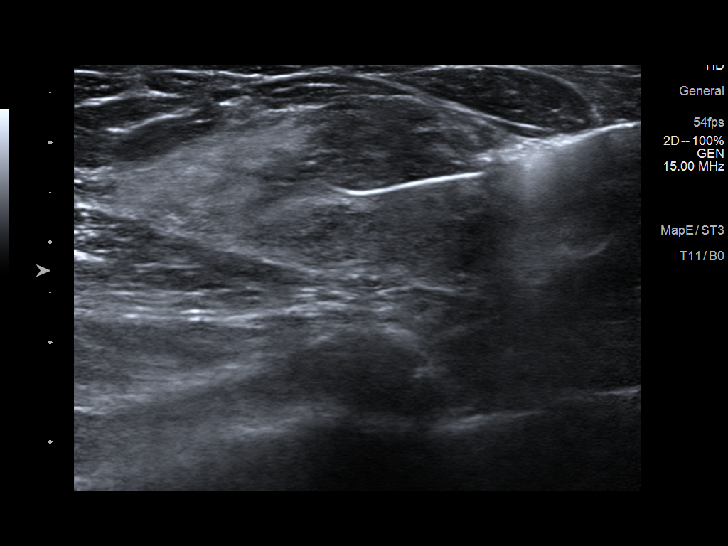
[im 10/12]
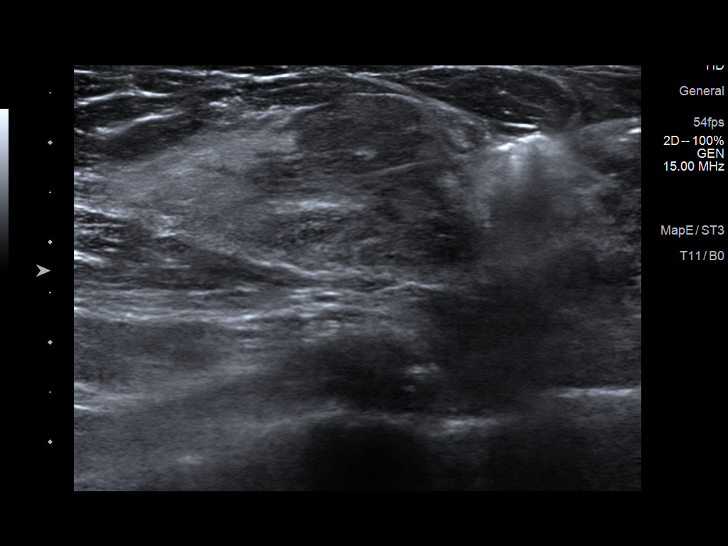
[im 11/12]
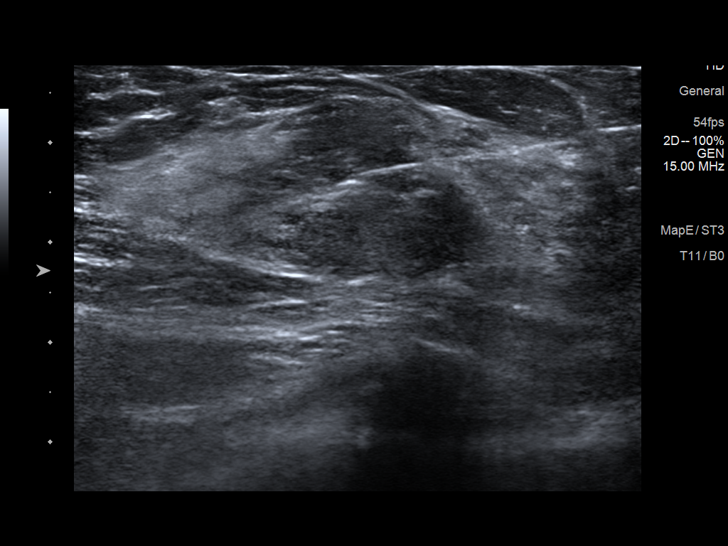
[im 12/12]
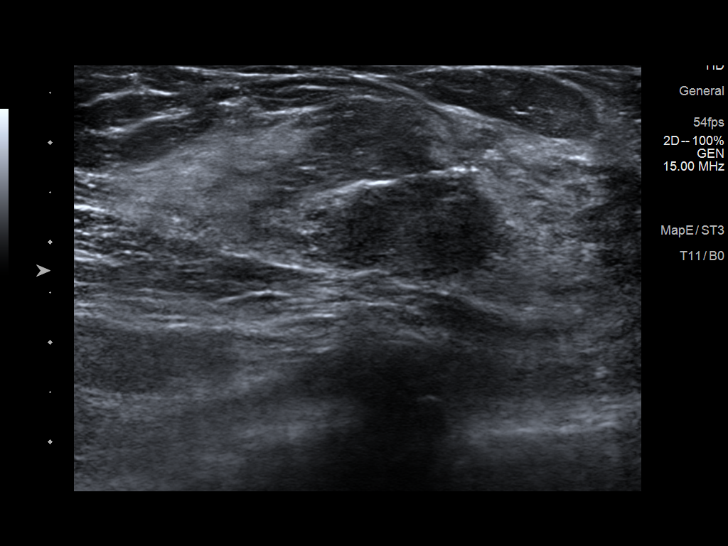

[12 of 12 positions shown; findings below may reference images not displayed]



Lesion quadrant: Left upper outer quadrant.

Using sterile technique and 1% Lidocaine as local anesthetic, under
direct ultrasound visualization, a 12 gauge [place] device was
used to perform biopsy of the targeted mass at the 1 o'clock
position using a inferior to superior approach. Three adequate
tissue specimens were obtained. At the conclusion of the procedure a
ribbon shaped tissue marker clip was deployed into the biopsy
cavity. Follow up 2 view mammogram was performed and dictated
separately.
IMPRESSION: Ultrasound guided biopsy of left breast mass. No apparent
complications.

## 2019-11-16 ENCOUNTER — Other Ambulatory Visit: Payer: Self-pay | Admitting: Physician Assistant

## 2019-11-16 DIAGNOSIS — Z1231 Encounter for screening mammogram for malignant neoplasm of breast: Secondary | ICD-10-CM

## 2020-02-14 ENCOUNTER — Other Ambulatory Visit: Payer: Self-pay | Admitting: Family Medicine

## 2020-02-14 ENCOUNTER — Ambulatory Visit
Admission: RE | Admit: 2020-02-14 | Discharge: 2020-02-14 | Disposition: A | Discharge status: Home / Self Care | Payer: 59 | Source: Ambulatory Visit | Attending: Family Medicine | Admitting: Family Medicine

## 2020-02-14 DIAGNOSIS — T1490XA Injury, unspecified, initial encounter: Secondary | ICD-10-CM

## 2020-03-01 ENCOUNTER — Ambulatory Visit
Admission: RE | Admit: 2020-03-01 | Discharge: 2020-03-01 | Disposition: A | Discharge status: Home / Self Care | Payer: 59 | Source: Ambulatory Visit | Attending: Physician Assistant | Admitting: Physician Assistant

## 2020-03-01 ENCOUNTER — Other Ambulatory Visit: Payer: Self-pay

## 2020-03-01 DIAGNOSIS — Z1231 Encounter for screening mammogram for malignant neoplasm of breast: Secondary | ICD-10-CM

## 2020-03-06 ENCOUNTER — Other Ambulatory Visit: Payer: Self-pay | Admitting: Physician Assistant

## 2020-03-06 DIAGNOSIS — R928 Other abnormal and inconclusive findings on diagnostic imaging of breast: Secondary | ICD-10-CM

## 2020-03-08 ENCOUNTER — Ambulatory Visit
Admission: RE | Admit: 2020-03-08 | Discharge: 2020-03-08 | Disposition: A | Discharge status: Home / Self Care | Payer: 59 | Source: Ambulatory Visit | Attending: Physician Assistant | Admitting: Physician Assistant

## 2020-03-08 ENCOUNTER — Other Ambulatory Visit: Payer: Self-pay

## 2020-03-08 DIAGNOSIS — R928 Other abnormal and inconclusive findings on diagnostic imaging of breast: Secondary | ICD-10-CM

## 2023-02-03 ENCOUNTER — Encounter: Payer: Self-pay | Admitting: Gastroenterology
